# Patient Record
Sex: Female | Born: 1955 | Race: Black or African American | Hispanic: No | Smoking: Never smoker
Health system: Southern US, Community
[De-identification: ages and names within clinical notes are randomized; demographics above are authoritative.]

## PROBLEM LIST (undated history)

## (undated) DIAGNOSIS — M1990 Unspecified osteoarthritis, unspecified site: Secondary | ICD-10-CM

## (undated) DIAGNOSIS — I214 Non-ST elevation (NSTEMI) myocardial infarction: Secondary | ICD-10-CM

## (undated) DIAGNOSIS — I219 Acute myocardial infarction, unspecified: Secondary | ICD-10-CM

## (undated) DIAGNOSIS — E7800 Pure hypercholesterolemia, unspecified: Secondary | ICD-10-CM

## (undated) DIAGNOSIS — E119 Type 2 diabetes mellitus without complications: Secondary | ICD-10-CM

## (undated) DIAGNOSIS — I10 Essential (primary) hypertension: Secondary | ICD-10-CM

## (undated) DIAGNOSIS — H269 Unspecified cataract: Secondary | ICD-10-CM

## (undated) HISTORY — DX: Type 2 diabetes mellitus without complications: E11.9

## (undated) HISTORY — PX: ABDOMINAL HYSTERECTOMY: SHX81

## (undated) HISTORY — PX: TUBAL LIGATION: SHX77

## (undated) HISTORY — PX: CARDIAC CATHETERIZATION: SHX172

## (undated) HISTORY — DX: Unspecified cataract: H26.9

---

## 1999-04-15 DIAGNOSIS — I219 Acute myocardial infarction, unspecified: Secondary | ICD-10-CM | POA: Insufficient documentation

## 1999-04-15 HISTORY — DX: Acute myocardial infarction, unspecified: I21.9

## 2015-12-07 ENCOUNTER — Encounter (HOSPITAL_COMMUNITY)
Admission: EM | Disposition: A | Discharge status: Home / Self Care | Payer: Self-pay | Source: Home / Self Care | Attending: Cardiology

## 2015-12-07 ENCOUNTER — Emergency Department (HOSPITAL_COMMUNITY): Payer: Self-pay

## 2015-12-07 ENCOUNTER — Inpatient Hospital Stay (HOSPITAL_COMMUNITY)
Admission: EM | Admit: 2015-12-07 | Discharge: 2015-12-08 | DRG: 282 | Disposition: A | Discharge status: Home / Self Care | Payer: Self-pay | Attending: Cardiology | Admitting: Cardiology

## 2015-12-07 ENCOUNTER — Encounter (HOSPITAL_COMMUNITY): Payer: Self-pay | Admitting: Cardiology

## 2015-12-07 ENCOUNTER — Ambulatory Visit (HOSPITAL_COMMUNITY): Admit: 2015-12-07 | Payer: Self-pay | Admitting: Cardiology

## 2015-12-07 DIAGNOSIS — Z8249 Family history of ischemic heart disease and other diseases of the circulatory system: Secondary | ICD-10-CM

## 2015-12-07 DIAGNOSIS — E785 Hyperlipidemia, unspecified: Secondary | ICD-10-CM | POA: Diagnosis present

## 2015-12-07 DIAGNOSIS — I252 Old myocardial infarction: Secondary | ICD-10-CM

## 2015-12-07 DIAGNOSIS — I2510 Atherosclerotic heart disease of native coronary artery without angina pectoris: Secondary | ICD-10-CM | POA: Diagnosis present

## 2015-12-07 DIAGNOSIS — I10 Essential (primary) hypertension: Secondary | ICD-10-CM | POA: Diagnosis present

## 2015-12-07 DIAGNOSIS — I214 Non-ST elevation (NSTEMI) myocardial infarction: Principal | ICD-10-CM | POA: Diagnosis present

## 2015-12-07 DIAGNOSIS — Z7982 Long term (current) use of aspirin: Secondary | ICD-10-CM

## 2015-12-07 HISTORY — DX: Acute myocardial infarction, unspecified: I21.9

## 2015-12-07 HISTORY — DX: Non-ST elevation (NSTEMI) myocardial infarction: I21.4

## 2015-12-07 HISTORY — DX: Essential (primary) hypertension: I10

## 2015-12-07 HISTORY — PX: CARDIAC CATHETERIZATION: SHX172

## 2015-12-07 HISTORY — DX: Pure hypercholesterolemia, unspecified: E78.00

## 2015-12-07 LAB — CBC
HCT: 38.2 % (ref 36.0–46.0)
Hemoglobin: 12.7 g/dL (ref 12.0–15.0)
MCH: 29.5 pg (ref 26.0–34.0)
MCHC: 33.2 g/dL (ref 30.0–36.0)
MCV: 88.8 fL (ref 78.0–100.0)
PLATELETS: 254 10*3/uL (ref 150–400)
RBC: 4.3 MIL/uL (ref 3.87–5.11)
RDW: 13.9 % (ref 11.5–15.5)
WBC: 8.1 10*3/uL (ref 4.0–10.5)

## 2015-12-07 LAB — BASIC METABOLIC PANEL
Anion gap: 11 (ref 5–15)
BUN: 9 mg/dL (ref 6–20)
CALCIUM: 9.9 mg/dL (ref 8.9–10.3)
CO2: 27 mmol/L (ref 22–32)
CREATININE: 0.82 mg/dL (ref 0.44–1.00)
Chloride: 100 mmol/L — ABNORMAL LOW (ref 101–111)
GFR calc Af Amer: >60 mL/min (ref >60)
GLUCOSE: 159 mg/dL — AB (ref 65–99)
Potassium: 3.6 mmol/L (ref 3.5–5.1)
SODIUM: 138 mmol/L (ref 135–145)

## 2015-12-07 LAB — I-STAT TROPONIN, ED
Troponin i, poc: 0.06 ng/mL (ref 0.00–0.08)
Troponin i, poc: 5.77 ng/mL (ref 0.00–0.08)

## 2015-12-07 LAB — POCT ACTIVATED CLOTTING TIME: Activated Clotting Time: 246 seconds

## 2015-12-07 LAB — LIPID PANEL
CHOL/HDL RATIO: 4.9 ratio
CHOLESTEROL: 194 mg/dL (ref 0–200)
HDL: 40 mg/dL — ABNORMAL LOW (ref >40)
LDL CALC: 135 mg/dL — AB (ref 0–99)
Triglycerides: 93 mg/dL (ref <150)
VLDL: 19 mg/dL (ref 0–40)

## 2015-12-07 LAB — PROTIME-INR
INR: 1.14
PROTHROMBIN TIME: 14.6 s (ref 11.4–15.2)

## 2015-12-07 LAB — TSH: TSH: 2.283 u[IU]/mL (ref 0.350–4.500)

## 2015-12-07 SURGERY — LEFT HEART CATH AND CORONARY ANGIOGRAPHY

## 2015-12-07 MED ORDER — SODIUM CHLORIDE 0.9 % IV SOLN
INTRAVENOUS | Status: DC | PRN
  Administered 2015-12-07: 70 mL/h via INTRAVENOUS

## 2015-12-07 MED ORDER — ASPIRIN 81 MG PO CHEW: 324.0000 mg | CHEWABLE_TABLET | ORAL | Status: DC

## 2015-12-07 MED ORDER — HEPARIN SODIUM (PORCINE) 1000 UNIT/ML IJ SOLN
INTRAMUSCULAR | Status: AC
  Filled 2015-12-07: qty 1

## 2015-12-07 MED ORDER — MIDAZOLAM HCL 2 MG/2ML IJ SOLN
INTRAMUSCULAR | Status: DC | PRN
  Administered 2015-12-07: 2 mg via INTRAVENOUS

## 2015-12-07 MED ORDER — SODIUM CHLORIDE 0.9 % WEIGHT BASED INFUSION: 3.0000 mL/kg/h | INTRAVENOUS | Status: DC

## 2015-12-07 MED ORDER — HEPARIN SODIUM (PORCINE) 1000 UNIT/ML IJ SOLN
INTRAMUSCULAR | Status: DC | PRN
  Administered 2015-12-07: 6000 [IU] via INTRAVENOUS

## 2015-12-07 MED ORDER — HYDROMORPHONE HCL 1 MG/ML IJ SOLN
INTRAMUSCULAR | Status: DC | PRN
  Administered 2015-12-07: 0.5 mg via INTRAVENOUS

## 2015-12-07 MED ORDER — SODIUM CHLORIDE 0.9 % WEIGHT BASED INFUSION: 1.0000 mL/kg/h | INTRAVENOUS | Status: DC

## 2015-12-07 MED ORDER — VERAPAMIL HCL 2.5 MG/ML IV SOLN
INTRAVENOUS | Status: AC
  Filled 2015-12-07: qty 2

## 2015-12-07 MED ORDER — HEPARIN (PORCINE) IN NACL 2-0.9 UNIT/ML-% IJ SOLN
INTRAMUSCULAR | Status: DC | PRN
  Administered 2015-12-07: 1000 mL

## 2015-12-07 MED ORDER — HEPARIN (PORCINE) IN NACL 100-0.45 UNIT/ML-% IJ SOLN: 750.0000 [IU]/h | INTRAMUSCULAR | Status: DC

## 2015-12-07 MED ORDER — LIDOCAINE HCL (PF) 1 % IJ SOLN
INTRAMUSCULAR | Status: AC
  Filled 2015-12-07: qty 30

## 2015-12-07 MED ORDER — ACETAMINOPHEN 325 MG PO TABS: 650.0000 mg | ORAL_TABLET | ORAL | Status: DC | PRN

## 2015-12-07 MED ORDER — ROSUVASTATIN CALCIUM 10 MG PO TABS
40.0000 mg | ORAL_TABLET | Freq: Every day | ORAL | Status: DC
  Administered 2015-12-07: 40 mg via ORAL
  Filled 2015-12-07: qty 4

## 2015-12-07 MED ORDER — ASPIRIN 300 MG RE SUPP: 300.0000 mg | RECTAL | Status: DC

## 2015-12-07 MED ORDER — HEPARIN SODIUM (PORCINE) 5000 UNIT/ML IJ SOLN
4000.0000 [IU] | Freq: Once | INTRAMUSCULAR | Status: AC
  Administered 2015-12-07: 4000 [IU] via INTRAVENOUS
  Filled 2015-12-07: qty 1

## 2015-12-07 MED ORDER — SODIUM CHLORIDE 0.9% FLUSH: 3.0000 mL | INTRAVENOUS | Status: DC | PRN

## 2015-12-07 MED ORDER — HEPARIN (PORCINE) IN NACL 2-0.9 UNIT/ML-% IJ SOLN
INTRAMUSCULAR | Status: AC
  Filled 2015-12-07: qty 1000

## 2015-12-07 MED ORDER — NITROGLYCERIN 0.4 MG SL SUBL
0.4000 mg | SUBLINGUAL_TABLET | SUBLINGUAL | Status: DC | PRN
  Administered 2015-12-07: 0.4 mg via SUBLINGUAL
  Filled 2015-12-07: qty 1

## 2015-12-07 MED ORDER — ONDANSETRON HCL 4 MG/2ML IJ SOLN: 4.0000 mg | Freq: Four times a day (QID) | INTRAMUSCULAR | Status: DC | PRN

## 2015-12-07 MED ORDER — IOPAMIDOL (ISOVUE-370) INJECTION 76%
INTRAVENOUS | Status: DC | PRN
Start: 2015-12-07 — End: 2015-12-07
  Administered 2015-12-07: 55 mL via INTRA_ARTERIAL

## 2015-12-07 MED ORDER — MIDAZOLAM HCL 2 MG/2ML IJ SOLN
INTRAMUSCULAR | Status: AC
  Filled 2015-12-07: qty 2

## 2015-12-07 MED ORDER — NITROGLYCERIN 1 MG/10 ML FOR IR/CATH LAB
INTRA_ARTERIAL | Status: AC
  Filled 2015-12-07: qty 10

## 2015-12-07 MED ORDER — ASPIRIN 81 MG PO CHEW: 81.0000 mg | CHEWABLE_TABLET | ORAL | Status: DC

## 2015-12-07 MED ORDER — CLOPIDOGREL BISULFATE 75 MG PO TABS: 600.0000 mg | ORAL_TABLET | Freq: Once | ORAL | Status: DC

## 2015-12-07 MED ORDER — CLOPIDOGREL BISULFATE 75 MG PO TABS
75.0000 mg | ORAL_TABLET | Freq: Every day | ORAL | Status: DC
  Administered 2015-12-08: 75 mg via ORAL
  Filled 2015-12-07: qty 1

## 2015-12-07 MED ORDER — IOPAMIDOL (ISOVUE-370) INJECTION 76%
INTRAVENOUS | Status: AC
  Filled 2015-12-07: qty 100

## 2015-12-07 MED ORDER — IRBESARTAN 150 MG PO TABS
150.0000 mg | ORAL_TABLET | Freq: Every day | ORAL | Status: DC
  Administered 2015-12-07: 150 mg via ORAL
  Filled 2015-12-07 (×2): qty 1

## 2015-12-07 MED ORDER — SODIUM CHLORIDE 0.9 % IV SOLN: 250.0000 mL | INTRAVENOUS | Status: DC | PRN

## 2015-12-07 MED ORDER — ASPIRIN EC 81 MG PO TBEC
81.0000 mg | DELAYED_RELEASE_TABLET | Freq: Every day | ORAL | Status: DC
  Administered 2015-12-08: 81 mg via ORAL
  Filled 2015-12-07: qty 1

## 2015-12-07 MED ORDER — VERAPAMIL HCL 2.5 MG/ML IV SOLN
INTRA_ARTERIAL | Status: DC | PRN
  Administered 2015-12-07: 10 mL via INTRA_ARTERIAL

## 2015-12-07 MED ORDER — SODIUM CHLORIDE 0.9% FLUSH
3.0000 mL | Freq: Two times a day (BID) | INTRAVENOUS | Status: DC
  Administered 2015-12-07: 3 mL via INTRAVENOUS

## 2015-12-07 MED ORDER — CARVEDILOL 6.25 MG PO TABS
6.2500 mg | ORAL_TABLET | Freq: Two times a day (BID) | ORAL | Status: DC
  Administered 2015-12-08: 6.25 mg via ORAL
  Filled 2015-12-07: qty 1

## 2015-12-07 MED ORDER — LIDOCAINE HCL (PF) 1 % IJ SOLN
INTRAMUSCULAR | Status: DC | PRN
  Administered 2015-12-07: 2 mL

## 2015-12-07 MED ORDER — HYDROMORPHONE HCL 1 MG/ML IJ SOLN
INTRAMUSCULAR | Status: AC
Start: 2015-12-07 — End: 2015-12-07
  Filled 2015-12-07: qty 1

## 2015-12-07 SURGICAL SUPPLY — 9 items
CATH INFINITI 5 FR JL3.5 (CATHETERS) ×2 IMPLANT
CATH OPTITORQUE TIG 4.0 5F (CATHETERS) ×2 IMPLANT
DEVICE RAD COMP TR BAND LRG (VASCULAR PRODUCTS) ×2 IMPLANT
GLIDESHEATH SLEND A-KIT 6F 20G (SHEATH) ×2 IMPLANT
KIT HEART LEFT (KITS) ×2 IMPLANT
PACK CARDIAC CATHETERIZATION (CUSTOM PROCEDURE TRAY) ×2 IMPLANT
TRANSDUCER W/STOPCOCK (MISCELLANEOUS) ×2 IMPLANT
TUBING CIL FLEX 10 FLL-RA (TUBING) ×2 IMPLANT
WIRE SAFE-T 1.5MM-J .035X260CM (WIRE) ×2 IMPLANT

## 2015-12-07 NOTE — Progress Notes (Signed)
ANTICOAGULATION CONSULT NOTE - Initial Consult  Pharmacy Consult for Heparin Indication: chest pain/ACS  No Known Allergies  Patient Measurements: Height: 5' (152.4 cm) Weight: 157 lb (71.2 kg) IBW/kg (Calculated) : 45.5 Heparin Dosing Weight: 61.2 kg  Vital Signs: Temp: 98.6 F (37 C) (08/25 0829) Temp Source: Oral (08/25 0829) BP: 112/98 (08/25 1530) Pulse Rate: 76 (08/25 1530)  Labs:  Recent Labs  12/07/15 0844  HGB 12.7  HCT 38.2  PLT 254  CREATININE 0.82    Estimated Creatinine Clearance: 63.5 mL/min (by C-G formula based on SCr of 0.82 mg/dL).   Medical History: No past medical history on file.  Assessment: Patient was admitted with chest pain. History of MI in 2001. PTA, pt on aspirin 81 mg qd. Troponin at 5.77. K and Mag WNL . CBC Normal. Scr 0.82. Patient is currently in Cath Lab.  Goal of Therapy:  Heparin level 0.3-0.7 units/ml Monitor platelets by anticoagulation protocol: Yes   Plan:  Give 3700 units bolus x 1  Continue Heparin gtt @ 750 units IV/hour Will not order labs at this time. Follow up after patient leaves Cath Lab.   Curt Bears Deryl Ports 12/07/2015,4:41 PM

## 2015-12-07 NOTE — ED Triage Notes (Signed)
Pt in from home via [facility], per report pt had mid non radiating CP onset upon awakening at 6 am, pt took 486 mg ASA, pt rcvd x 2 SL nitro pta, pain from 10/10 to 0/10 post nitro, pt hx of MI, denies N/V/D & SOB, A&O x4

## 2015-12-07 NOTE — ED Notes (Signed)
Cardiologist in room

## 2015-12-07 NOTE — ED Provider Notes (Signed)
[facility] DEPT Provider Note   CSN: [id number] Arrival date & time: 12/07/15  0825     History   Chief Complaint Chief Complaint  Patient presents with  . Chest Pain    HPI Tiffany Powers is a 60 y.o. female.  HPI Patient presents with right to left chest cramping that started around 6:30 this morning while she was showering. No associated shortness of breath or nausea. States her symptoms are similar to when she had a heart attack back in 2001. She states she was treated medically with no stent placement. She recently moved from [place] and does not have a cardiologist in [place]. Was given aspirin and nitroglycerin 2 in route by EMS with significant improvement of her chest pain. States that her pain is currently 3/10. Denies any recent illness, cough, fever or chills. Denies any recent extended travel. No lower extremity swelling or pain. No past medical history on file.  There are no active problems to display for this patient.   No past surgical history on file.  OB History    No data available       Home Medications    Prior to Admission medications   Medication Sig Start Date End Date Taking? Authorizing Provider  aspirin EC 81 MG tablet Take 81 mg by mouth daily.   Yes Historical Provider, MD  Cyanocobalamin (VITAMIN B-12 PO) Take 1 tablet by mouth daily.   Yes Historical Provider, MD  hydrochlorothiazide (HYDRODIURIL) 25 MG tablet Take 25 mg by mouth daily.   Yes Historical Provider, MD  rosuvastatin (CRESTOR) 40 MG tablet Take 40 mg by mouth daily.   Yes Historical Provider, MD  telmisartan (MICARDIS) 40 MG tablet Take 40 mg by mouth daily.   Yes Historical Provider, MD    Family History No family history on file.  Social History Social History  Substance Use Topics  . Smoking status: Not on file  . Smokeless tobacco: Not on file  . Alcohol use Not on file     Allergies   Review of patient's allergies indicates no known  allergies.   Review of Systems Review of Systems  Constitutional: Negative for chills and fever.  Respiratory: Negative for chest tightness and shortness of breath.   Cardiovascular: Positive for chest pain. Negative for leg swelling.  Gastrointestinal: Negative for abdominal pain, diarrhea, nausea and vomiting.  Genitourinary: Negative for dysuria, flank pain and frequency.  Musculoskeletal: Negative for back pain, myalgias, neck pain and neck stiffness.  Skin: Negative for rash and wound.  Neurological: Negative for dizziness, weakness, light-headedness, numbness and headaches.  All other systems reviewed and are negative.    Physical Exam Updated Vital Signs BP 136/71   Pulse 76   Temp 98.6 F (37 C) (Oral)   Resp 17   SpO2 96%   Physical Exam  Constitutional: She is oriented to person, place, and time. She appears well-developed and well-nourished.  HENT:  Head: Normocephalic and atraumatic.  Mouth/Throat: Oropharynx is clear and moist.  Eyes: EOM are normal. Pupils are equal, round, and reactive to light.  Neck: Normal range of motion. Neck supple. No JVD present.  Cardiovascular: Normal rate, regular rhythm and normal heart sounds.  Exam reveals no gallop and no friction rub.   No murmur heard. Pulmonary/Chest: Effort normal and breath sounds normal. No respiratory distress. She has no wheezes. She has no rales. She exhibits no tenderness.  Abdominal: Soft. Bowel sounds are normal. There is no tenderness. There is no rebound and no  guarding.  Musculoskeletal: Normal range of motion. She exhibits no edema or tenderness.  No lower sugary swelling, asymmetry or tenderness. Distal pulses are equal  Neurological: She is alert and oriented to person, place, and time.  Moves all extremities without deficit. Sensation is fully intact.  Skin: Skin is warm and dry. No rash noted. No erythema.  Psychiatric: She has a normal mood and affect. Her behavior is normal.  Nursing note  and vitals reviewed.    ED Treatments / Results  Labs (all labs ordered are listed, but only abnormal results are displayed) Labs Reviewed  BASIC METABOLIC PANEL - Abnormal; Notable for the following:       Result Value   Chloride 100 (*)    Glucose, Bld 159 (*)    All other components within normal limits  I-STAT TROPOININ, ED - Abnormal; Notable for the following:    Troponin i, poc 5.77 (*)    All other components within normal limits  CBC  I-STAT TROPOININ, ED    EKG  EKG Interpretation  Date/Time:  Friday December 07 2015 08:41:38 EDT Ventricular Rate:  68 PR Interval:    QRS Duration: 96 QT Interval:  402 QTC Calculation: 428 R Axis:   -113 Text Interpretation:  Sinus rhythm Left anterior fascicular block Abnormal R-wave progression, late transition Borderline T abnormalities, anterior leads Confirmed by Lita Mains  MD, Manjot Beumer ([id number]) on 12/07/2015 9:17:37 AM       Radiology Dg Chest 2 View  Result Date: 12/07/2015 CLINICAL DATA:  Right-sided chest pain radiating into both buttocks, history of MI in the past, nonsmoker. EXAM: CHEST  2 VIEW COMPARISON:  None in PACs FINDINGS: The lungs are adequately inflated and clear. The heart and pulmonary vascularity are normal. The mediastinum is normal in width. There is no pleural effusion. 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 IMPRESSION: There is no active cardiopulmonary disease. Electronically Signed   By: Alli Jasmer  Martinique M.D.   On: 12/07/2015 09:13    Procedures Procedures (including critical care time)  Medications Ordered in ED Medications  nitroGLYCERIN (NITROSTAT) SL tablet 0.4 mg (0.4 mg Sublingual Given 12/07/15 1108)  heparin injection 4,000 Units (4,000 Units Intravenous Given 12/07/15 1418)     Initial Impression / Assessment and Plan / ED Course  I have reviewed the triage vital signs and the nursing notes.  Pertinent labs & imaging results that were available during my care of  the patient were reviewed by me and considered in my medical decision making (see chart for details).  Clinical Course   Patient given 1 dose of nitroglycerin emergency department with complete resolution of her chest pain and pressure. Discussed with nurse for Dr. Einar Gip. We will evaluate patient in the emergency department.  Repeat troponin positive at 5.77. Patient remains chest pain-free. We'll start heparin and repage Dr. Einar Gip.   Dr. Einar Gip will admit and cath today. Advises to keep patient nothing by mouth.   Final Clinical Impressions(s) / ED Diagnoses   Final diagnoses:  NSTEMI (non-ST elevated myocardial infarction) [facility])    New Prescriptions New Prescriptions   No medications on file     Julianne Rice, MD 12/07/15 1515

## 2015-12-07 NOTE — Progress Notes (Addendum)
Patient arrived to 2W via bed from cath lab in no apparent distress.  Patient was oriented to unit to include phone and call light.  Telemetry monitor was applied and CCMD notified.  Will continue to monitor.

## 2015-12-07 NOTE — H&P (Signed)
Tiffany Powers is an 60 y.o. female.   Chief Complaint: Chest pain HPI: Tiffany Powers  is a 60 y.o. female with a history of HTN, HLD, and CAD s/p MI in 2001, treated medically in [place].  She recently moved from [place] and does not have a cardiologist in [place]. Patient presented with left chest cramping that started around 6:30 this morning while she was showering. States symptoms felt similar to previous MI. No associated shortness of breath or nausea, however, reported nausea and fatigue yesterday. Was given aspirin and nitroglycerin 2 in route by EMS with significant improvement of her chest pain. Currently pain free. Denies any recent illness, cough, fever or chills. Denies any recent extended travel. No lower extremity swelling or pain.  No past medical history on file.  No past surgical history on file.  No family history on file. Social History:  has no tobacco, alcohol, and drug history on file.  Allergies: No Known Allergies  Review of Systems - History obtained from the patient General ROS: positive for  - fatigue negative for - fever, weight gain or weight loss Respiratory ROS: no cough, shortness of breath, or wheezing Cardiovascular ROS: positive for - chest pain negative for - edema, orthopnea, palpitations or paroxysmal nocturnal dyspnea Gastrointestinal ROS: positive for - nausea negative for - abdominal pain Neurological ROS: no TIA or stroke symptoms  Blood pressure 123/68, pulse 74, temperature 98.6 F (37 C), temperature source Oral, resp. rate 19, SpO2 97 %.   General appearance: alert, cooperative, appears stated age and no distress Eyes: negative Neck: no adenopathy, no carotid bruit, no JVD, supple, symmetrical, trachea midline and thyroid not enlarged, symmetric, no tenderness/mass/nodules Resp: clear to auscultation bilaterally Chest wall: no tenderness Cardio: regular rate and rhythm, S1, S2 normal, no murmur, click, rub or gallop GI: soft,  non-tender; bowel sounds normal; no masses,  no organomegaly Extremities: extremities normal, atraumatic, no cyanosis or edema Pulses: 2+ and symmetric Skin: Skin color, texture, turgor normal. No rashes or lesions Neurologic: Grossly normal  Results for orders placed or performed during the hospital encounter of 12/07/15 (from the past 48 hour(s))  Basic metabolic panel     Status: Abnormal   Collection Time: 12/07/15  8:44 AM  Result Value Ref Range   Sodium 138 135 - 145 mmol/L   Potassium 3.6 3.5 - 5.1 mmol/L   Chloride 100 (L) 101 - 111 mmol/L   CO2 27 22 - 32 mmol/L   Glucose, Bld 159 (H) 65 - 99 mg/dL   BUN 9 6 - 20 mg/dL   Creatinine, Ser 0.82 0.44 - 1.00 mg/dL   Calcium 9.9 8.9 - 10.3 mg/dL   GFR calc non Af Amer >60 >60 mL/min   GFR calc Af Amer >60 >60 mL/min    Comment: (NOTE) The eGFR has been calculated using the CKD EPI equation. This calculation has not been validated in all clinical situations. eGFR's persistently <60 mL/min signify possible Chronic Kidney Disease.    Anion gap 11 5 - 15  CBC     Status: None   Collection Time: 12/07/15  8:44 AM  Result Value Ref Range   WBC 8.1 4.0 - 10.5 K/uL   RBC 4.30 3.87 - 5.11 MIL/uL   Hemoglobin 12.7 12.0 - 15.0 g/dL   HCT 38.2 36.0 - 46.0 %   MCV 88.8 78.0 - 100.0 fL   MCH 29.5 26.0 - 34.0 pg   MCHC 33.2 30.0 - 36.0 g/dL   RDW 13.9 11.5 -  15.5 %   Platelets 254 150 - 400 K/uL  I-stat troponin, ED     Status: None   Collection Time: 12/07/15  8:56 AM  Result Value Ref Range   Troponin i, poc 0.06 0.00 - 0.08 ng/mL   Comment 3            Comment: Due to the release kinetics of cTnI, a negative result within the first hours of the onset of symptoms does not rule out myocardial infarction with certainty. If myocardial infarction is still suspected, repeat the test at appropriate intervals.   I-stat troponin, ED     Status: Abnormal   Collection Time: 12/07/15  1:51 PM  Result Value Ref Range   Troponin i,  poc 5.77 (HH) 0.00 - 0.08 ng/mL   Comment NOTIFIED PHYSICIAN    Comment 3            Comment: Due to the release kinetics of cTnI, a negative result within the first hours of the onset of symptoms does not rule out myocardial infarction with certainty. If myocardial infarction is still suspected, repeat the test at appropriate intervals.    Dg Chest 2 View  Result Date: 12/07/2015 CLINICAL DATA:  Right-sided chest pain radiating into both buttocks, history of MI in the past, nonsmoker. EXAM: CHEST  2 VIEW COMPARISON:  None in PACs FINDINGS: The lungs are adequately inflated and clear. The heart and pulmonary vascularity are normal. The mediastinum is normal in width. There is no pleural effusion. 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 IMPRESSION: There is no active cardiopulmonary disease. Electronically Signed   By: David  Martinique M.D.   On: 12/07/2015 09:13    Labs:   Lab Results  Component Value Date   WBC 8.1 12/07/2015   HGB 12.7 12/07/2015   HCT 38.2 12/07/2015   MCV 88.8 12/07/2015   PLT 254 12/07/2015    Recent Labs Lab 12/07/15 0844  NA 138  K 3.6  CL 100*  CO2 27  BUN 9  CREATININE 0.82  CALCIUM 9.9  GLUCOSE 159*    Lipid Panel  No results found for: CHOL, TRIG, HDL, CHOLHDL, VLDL, LDLCALC  BNP (last 3 results) No results for input(s): BNP in the last 8760 hours.  HEMOGLOBIN A1C No results found for: HGBA1C, MPG  Cardiac Panel (last 3 results) No results for input(s): CKTOTAL, CKMB, TROPONINI, RELINDX in the last 8760 hours.  No results found for: CKTOTAL, CKMB, CKMBINDEX, TROPONINI   TSH No results for input(s): TSH in the last 8760 hours.  EKG 12/07/2015 at 0841: sinus rhythm at a rate of 68bpm, LAD, LAFB, PRWP, anterolateral T wave abnormality, cannot exclude ischemia.   (Not in a hospital admission)    Current Facility-Administered Medications:  .  heparin injection 4,000 Units, 4,000 Units, Intravenous, Once,  Julianne Rice, MD .  nitroGLYCERIN (NITROSTAT) SL tablet 0.4 mg, 0.4 mg, Sublingual, Q5 min PRN, Julianne Rice, MD, 0.4 mg at 12/07/15 1108  Current Outpatient Prescriptions:  .  aspirin EC 81 MG tablet, Take 81 mg by mouth daily., Disp: , Rfl:  .  Cyanocobalamin (VITAMIN B-12 PO), Take 1 tablet by mouth daily., Disp: , Rfl:  .  hydrochlorothiazide (HYDRODIURIL) 25 MG tablet, Take 25 mg by mouth daily., Disp: , Rfl:  .  rosuvastatin (CRESTOR) 40 MG tablet, Take 40 mg by mouth daily., Disp: , Rfl:  .  telmisartan (MICARDIS) 40 MG tablet, Take 40 mg by mouth daily., Disp: , Rfl:  Assessment/Plan 1. NSTEMI 2. CAD s/p MI in 2001, treated medically ([place]) 3. HLD 4. Essential Hypertension 5. Family history of premature CAD (Father had multiple MIs in early and mid 58s, died from MI in 51s, brother died from MI in late 62s.  Recommendation: Pt's symptoms concerning for unstable angina, now with elevated troponin, initial troponin negative. Will plan for coronary angiogram this afternoon for NSTEMI. Dr. Einar Gip to see to with further recommendations.  Rachel Bo, NP-C 12/07/2015, 2:16 PM [facility] Pager: [phone] Office: [phone]

## 2015-12-08 MED ORDER — CARVEDILOL 6.25 MG PO TABS: 6.2500 mg | ORAL_TABLET | Freq: Two times a day (BID) | ORAL | 1 refills | Status: DC

## 2015-12-08 MED ORDER — NITROGLYCERIN 0.4 MG SL SUBL: 0.4000 mg | SUBLINGUAL_TABLET | SUBLINGUAL | 1 refills | Status: DC | PRN

## 2015-12-08 MED ORDER — CLOPIDOGREL BISULFATE 75 MG PO TABS: 75.0000 mg | ORAL_TABLET | Freq: Every day | ORAL | 1 refills | Status: DC

## 2015-12-08 NOTE — Progress Notes (Signed)
TR Band removed, right radial vascular site Level 0, no bleeding noted. Occlusive dressing applied to right radial.

## 2015-12-08 NOTE — Progress Notes (Signed)
Pt/family given discharge instructions, medication lists, follow up appointments, and when to call the doctor.  Pt/family verbalizes understanding. Lewellyn Fultz McClintock, RN   

## 2015-12-08 NOTE — Discharge Summary (Signed)
Physician Discharge Summary  Patient ID: Tiffany Powers MRN: [id number] DOB/AGE: 1955/11/16 60 y.o.  Admit date: 12/07/2015 Discharge date: 12/08/2015  Discharge Diagnoses: 1. NSTEMI 2. CAD s/p MI in 2001, treated medically ([place]) 3. HLD 4. Essential Hypertension 5. Family history of premature CAD (Father had multiple MIs in early and mid 7s, died from MI in 25s, brother died from MI in late 57s.  Significant Diagnostic Studies: Coronary Angiogram 12/07/2015: Normal LV systolic function, no wall motion of normality. EF 55-60%. Normal coronary arteries except for ostial LAD, at most 30% stenosis, otherwise smooth and normal. A very small ramus intermediate which measures less than 0.5 mm and has very small area of distribution has a hazy 99% stenosis, probably the culprit lesion. RCA dominant and smooth and normal, circumflex normal.  Hospital Course: Tiffany Powers  is a 60 y.o. female with a history of HTN, HLD, and CAD s/p MI in 2001, treated medically in [place].  She recently moved from [place] and does not have a cardiologist in [place]. Patient presented with left chest cramping that started around 6:30 this morning while she was showering. States symptoms felt similar to previous MI. No associated shortness of breath or nausea, however, reported nausea and fatigue yesterday. Was given aspirin and nitroglycerin 2 in route by EMS with significant improvement of her chest pain. Currently pain free. Denies any recent illness, cough, fever or chills. Denies any recent extended travel. No lower extremity swelling or pain.  She was taken to the cath lab due to elevated delta troponin, coronary angiogram revealed ostial LAD with at most 30% stenosis, otherwise smooth and normal, a very small ramus intermediate which measures less than 0.5 mm and has very small area of distribution has a hazy 99% stenosis, probably the culprit lesion. RCA dominant and smooth and normal, circumflex  normal. Medical therapy recommended.   Recommendations on discharge: Add Coreg and plavix, otherwise on appropriate medical therapy. Follow up outpatient.   Discharge Exam: Blood pressure (!) 102/50, pulse 73, temperature 98.7 F (37.1 C), temperature source Oral, resp. rate 18, height 5' (1.524 m), weight 71.2 kg (157 lb), SpO2 99 %.    General appearance: alert, cooperative, appears stated age and no distress Eyes: negative Neck: no adenopathy, no carotid bruit, no JVD, supple, symmetrical, trachea midline and thyroid not enlarged, symmetric, no tenderness/mass/nodules Resp: clear to auscultation bilaterally Chest wall: no tenderness Cardio: regular rate and rhythm, S1, S2 normal, no murmur, click, rub or gallop GI: soft, non-tender; bowel sounds normal; no masses,  no organomegaly Extremities: extremities normal, atraumatic, no cyanosis or edema Pulses: 2+ and symmetric Skin: Skin color, texture, turgor normal. No rashes or lesions Neurologic: Grossly normal  Labs:   Lab Results  Component Value Date   WBC 8.1 12/07/2015   HGB 12.7 12/07/2015   HCT 38.2 12/07/2015   MCV 88.8 12/07/2015   PLT 254 12/07/2015    Recent Labs Lab 12/07/15 0844  NA 138  K 3.6  CL 100*  CO2 27  BUN 9  CREATININE 0.82  CALCIUM 9.9  GLUCOSE 159*    Lipid Panel     Component Value Date/Time   CHOL 194 12/07/2015 1637   TRIG 93 12/07/2015 1637   HDL 40 (L) 12/07/2015 1637   CHOLHDL 4.9 12/07/2015 1637   VLDL 19 12/07/2015 1637   LDLCALC 135 (H) 12/07/2015 1637    BNP (last 3 results) No results for input(s): BNP in the last 8760 hours.  HEMOGLOBIN A1C No results  found for: HGBA1C, MPG  Cardiac Panel (last 3 results) No results for input(s): CKTOTAL, CKMB, TROPONINI, RELINDX in the last 8760 hours.  No results found for: CKTOTAL, CKMB, CKMBINDEX, TROPONINI   TSH  Recent Labs  12/07/15 1637  TSH 2.283   EKG 12/08/2015: sinus rhythm at a rate of 72 bpm, PRWP, anterolateral  T wave abnormality, cannot exclude ischemia.  EKG 12/07/2015 at 0841: sinus rhythm at a rate of 68bpm, LAD, LAFB, PRWP, anterolateral T wave abnormality, cannot exclude ischemia.   Radiology: Dg Chest 2 View  Result Date: 12/07/2015 CLINICAL DATA:  Right-sided chest pain radiating into both buttocks, history of MI in the past, nonsmoker. EXAM: CHEST  2 VIEW COMPARISON:  None in PACs FINDINGS: The lungs are adequately inflated and clear. The heart and pulmonary vascularity are normal. The mediastinum is normal in width. There is no pleural effusion. 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 IMPRESSION: There is no active cardiopulmonary disease. Electronically Signed   By: David  Martinique M.D.   On: 12/07/2015 09:13      FOLLOW UP PLANS AND APPOINTMENTS  Follow-up Information    Rachel Bo, NP. Schedule an appointment as soon as possible for a visit in 1 week(s).   Specialty:  Nurse Practitioner Contact information: [address] [place] [address] [phone]             Medication List    TAKE these medications   aspirin EC 81 MG tablet Take 81 mg by mouth daily.   carvedilol 6.25 MG tablet Commonly known as:  COREG Take 1 tablet (6.25 mg total) by mouth 2 (two) times daily with a meal.   clopidogrel 75 MG tablet Commonly known as:  PLAVIX Take 1 tablet (75 mg total) by mouth daily.   hydrochlorothiazide 25 MG tablet Commonly known as:  HYDRODIURIL Take 25 mg by mouth daily.   nitroGLYCERIN 0.4 MG SL tablet Commonly known as:  NITROSTAT Place 1 tablet (0.4 mg total) under the tongue every 5 (five) minutes as needed for chest pain.   rosuvastatin 40 MG tablet Commonly known as:  CRESTOR Take 40 mg by mouth daily.   telmisartan 40 MG tablet Commonly known as:  MICARDIS Take 40 mg by mouth daily.   VITAMIN B-12 PO Take 1 tablet by mouth daily.         Rachel Bo, NP-C 12/08/2015, 9:14  AM [facility] Pager: [phone] Office: [phone]

## 2015-12-08 NOTE — Discharge Instructions (Signed)
Acute Coronary Syndrome  Acute coronary syndrome (ACS) is a serious problem in which there is suddenly not enough blood and oxygen supplied to the heart. ACS may mean that one or more of the blood vessels in your heart (coronary arteries) may be blocked. ACS can result in chest pain or a heart attack (myocardial infarction or MI).  CAUSES  This condition is caused by atherosclerosis, which is the buildup of fat and cholesterol (plaque) on the inside of the arteries. Over time, the plaque may narrow or block the artery, and this will lessen blood flow to the heart. Plaque can also become weak and break off within a coronary artery to form a clot and cause a sudden blockage.  RISK FACTORS  The risks factors of this condition include:   High cholesterol levels.   High blood pressure (hypertension).   Smoking.   Diabetes.   Age.   Family history of chest pain, heart disease, or stroke.   Lack of exercise.  SYMPTOMS  The most common signs of this condition include:   Chest pain, which can be:    A crushing or squeezing in the chest.    A tightness, pressure, fullness, or heaviness in the chest.    Present for more than a few minutes, or it can stop and recur.   Pain in the arms, neck, jaw, or back.   Unexplained heartburn or indigestion.   Shortness of breath.   Nausea.   Sudden cold sweats.   Feeling light-headed or dizzy.  Sometimes, this condition has no symptoms.  DIAGNOSIS  ACS may be diagnosed through the following tests:   Electrocardiogram (ECG).   Blood tests.   Coronary angiogram. This is a procedure to look at the coronary arteries to see if there is any blockage.  TREATMENT  Treatment for ACS may include:   Healthy behavioral changes to reduce or control risk factors.   Medicine.   Coronary stenting.A stent helps to keep an artery open.   Coronary angioplasty. This procedure widens a narrowed or blocked artery.   Coronary artery bypass surgery. This will allow your blood to pass the  blockage (bypass) to reach your heart.  HOME CARE INSTRUCTIONS  Eating and Drinking   Follow a heart-healthy diet. A dietitian can you help to educate you about healthy food options and changes.   Use healthy cooking methods such as roasting, grilling, broiling, baking, poaching, steaming, or stir-frying. Talk to a dietitian to learn more about healthy cooking methods.  Medicines   Take medicines only as directed by your health care provider.   Do not take the following medicines unless your health care provider approves:    Nonsteroidal anti-inflammatory drugs (NSAIDs), such as ibuprofen, naproxen, or celecoxib.    Vitamin supplements that contain vitamin A, vitamin E, or both.    Hormone replacement therapy that contains estrogen with or without progestin.   Stop illegal drug use.  Activities   Follow an exercise program that is approved by your health care provider.   Plan rest periods when you are fatigued.  Lifestyle   Do not use any tobacco products, including cigarettes, chewing tobacco, or electronic cigarettes. If you need help quitting, ask your health care provider.   If you drink alcohol, and your health care provider approves, limit your alcohol intake to no more than 1 drink per day. One drink equals 12 ounces of beer, 5 ounces of wine, or 1 ounces of hard liquor.   Learn to manage   stress.   Maintain a healthy weight. Lose weight as approved by your health care provider.  General Instructions   Manage other health conditions, such as hypertension and diabetes, as directed by your health care provider.   Keep all follow-up visits as directed by your health care provider. This is important.   Your health care provider may ask you to monitor your blood pressure. A blood pressure reading consists of a higher number over a lower number, such as 110 over 72, written as 110/72. Ideally, your blood pressure should be:    Below 140/90 if you have no other medical conditions.    Below 130/80 if  you have diabetes or kidney disease.  SEEK IMMEDIATE MEDICAL CARE IF:   You have pain in your chest, neck, arm, jaw, stomach, or back that lasts more than a few minutes, is recurring, or is not relieved by taking medicine under your tongue (sublingual nitroglycerin).   You have profuse sweating without cause.   You have unexplained:    Heartburn or indigestion.    Shortness of breath or difficulty breathing.    Nausea or vomiting.    Fatigue.    Feelings of nervousness or anxiety.    Weakness.    Diarrhea.   You have sudden light-headedness or dizziness.   You faint.  These symptoms may represent a serious problem that is an emergency. Do not wait to see if the symptoms will go away. Get medical help right away. Call your local emergency services (911 in the [place] Do not drive yourself to the clinic or hospital.     This information is not intended to replace advice given to you by your health care provider. Make sure you discuss any questions you have with your health care provider.     Document Released: 03/31/2005 Document Revised: 04/21/2014 Document Reviewed: 08/02/2013  Elsevier Interactive Patient Education 2016 Elsevier Inc.

## 2015-12-10 ENCOUNTER — Encounter (HOSPITAL_COMMUNITY): Payer: Self-pay | Admitting: Cardiology

## 2016-07-30 ENCOUNTER — Ambulatory Visit (INDEPENDENT_AMBULATORY_CARE_PROVIDER_SITE_OTHER): Payer: BLUE CROSS/BLUE SHIELD | Admitting: Family Medicine

## 2016-07-30 VITALS — BP 116/56 | HR 70 | Temp 98.3°F | Ht 62.0 in | Wt 157.8 lb

## 2016-07-30 DIAGNOSIS — Z1329 Encounter for screening for other suspected endocrine disorder: Secondary | ICD-10-CM | POA: Diagnosis not present

## 2016-07-30 DIAGNOSIS — I252 Old myocardial infarction: Secondary | ICD-10-CM | POA: Diagnosis not present

## 2016-07-30 DIAGNOSIS — Z130 Encounter for screening for diseases of the blood and blood-forming organs and certain disorders involving the immune mechanism: Secondary | ICD-10-CM

## 2016-07-30 DIAGNOSIS — Z131 Encounter for screening for diabetes mellitus: Secondary | ICD-10-CM | POA: Diagnosis not present

## 2016-07-30 DIAGNOSIS — F5102 Adjustment insomnia: Secondary | ICD-10-CM

## 2016-07-30 DIAGNOSIS — E785 Hyperlipidemia, unspecified: Secondary | ICD-10-CM

## 2016-07-30 DIAGNOSIS — Z1321 Encounter for screening for nutritional disorder: Secondary | ICD-10-CM

## 2016-07-30 HISTORY — DX: Hyperlipidemia, unspecified: E78.5

## 2016-07-30 HISTORY — DX: Adjustment insomnia: F51.02

## 2016-07-30 MED ORDER — ROSUVASTATIN CALCIUM 40 MG PO TABS: 40.0000 mg | ORAL_TABLET | Freq: Every day | ORAL | 3 refills | Status: DC

## 2016-07-30 MED ORDER — ZOLPIDEM TARTRATE 5 MG PO TABS: 5.0000 mg | ORAL_TABLET | Freq: Every evening | ORAL | 1 refills | Status: DC | PRN

## 2016-07-30 NOTE — Progress Notes (Addendum)
[place] at [facility] [address] [place], [place] [address] [phone]  Date:  07/30/2016   Name:  Tiffany Powers   DOB:  [birth year]   MRN:  [id number]  PCP:  Lamar Blinks, MD    Chief Complaint: Establish Care (Would like to re-start Ambien)   History of Present Illness:  Tiffany Powers is a 61 y.o. very pleasant female patient who presents with the following: Establishing care with me today  She had been living in this area in the past, then moved to [place] for several years, returned here last year to be closer to family She did have a NSTEMI last year- otherwise she has been in good health.   Her dad and brother both died of heart attacks She has seen Dr. 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  Her mother is still safe in her own home, but she does need close supervision.  Her sister also has mild dementia at a young age  She is not taking aspirin on the advice of cardiology- she does take plavix at this time She has not needed to use her nitro  She would like for [place] to do her OB-GYN care which is fine.    She last had labs in the fall through wellness program for her work She is not fasting right now but would like to go ahead and get labs today She is a never smoker  She has used [place] in the past for periodic insomnia.  Wonders if she might go back on this for prn use.  She finds that some nights she will worry about her family and lie awake for hours.  However she is not experiencing depression.  She has used [place] safely in the past No other sedating medications Patient Active  Problem List   Diagnosis Date Noted  . NSTEMI (non-ST elevated myocardial infarction) ([place]) 12/07/2015    Past Medical History:  Diagnosis Date  . High cholesterol   . Hypertension   . MI (myocardial infarction) 2001  . NSTEMI (non-ST elevated myocardial infarction) ([place]) 12/07/2015    Past Surgical History:  Procedure Laterality Date  . CARDIAC CATHETERIZATION  2001; 2002; 12/06/2105  . CARDIAC CATHETERIZATION N/A 12/07/2015   Procedure: Left Heart Cath and Coronary Angiography;  Surgeon: [name] Prows, MD;  Location: [facility];  Service: Cardiovascular;  Laterality: N/A;  . TUBAL LIGATION  1980s    Social History  Substance Use Topics  . Smoking status: Never Smoker  . Smokeless tobacco: Never Used  . Alcohol use Not on file    Family History  Problem Relation Age of Onset  . CAD Father 32    MI  . CAD Brother 6    MI  . CAD Maternal Grandmother 65    CABG    No Known Allergies  Medication list has been reviewed and updated.  Current Outpatient Prescriptions on File Prior to Visit  Medication Sig Dispense Refill  . carvedilol (COREG) 6.25 MG tablet Take 1 tablet (6.25 mg total) by mouth 2 (two) times daily with a meal. 60 tablet 1  .  clopidogrel (PLAVIX) 75 MG tablet Take 1 tablet (75 mg total) by mouth daily. 30 tablet 1  . Cyanocobalamin (VITAMIN B-12 PO) Take 1 tablet by mouth daily.    . hydrochlorothiazide (HYDRODIURIL) 25 MG tablet Take 25 mg by mouth daily.    . nitroGLYCERIN (NITROSTAT) 0.4 MG SL tablet Place 1 tablet (0.4 mg total) under the tongue every 5 (five) minutes as needed for chest pain. 25 tablet 1  . rosuvastatin (CRESTOR) 40 MG tablet Take 40 mg by mouth daily.    Marland Kitchen telmisartan (MICARDIS) 40 MG tablet Take 40 mg by mouth daily.    Marland Kitchen aspirin EC 81 MG tablet Take 81 mg by mouth daily.     No current facility-administered medications on file prior to visit.     Review of Systems:  As per HPI- otherwise negative. No CP or SOB No  nausea, vomiting, diarrhea No rash  Physical Examination: Vitals:   07/30/16 1509  BP: (!) 116/56  Pulse: 70  Temp: 98.3 F (36.8 C)   Vitals:   07/30/16 1509  Weight: 157 lb 12.8 oz (71.6 kg)  Height: 5\' 2"  (1.575 m)   Body mass index is 28.86 kg/m. Ideal Body Weight: Weight in (lb) to have BMI = 25: 136.4  GEN: WDWN, NAD, Non-toxic, A & O x 3 HEENT: Atraumatic, Normocephalic. Neck supple. No masses, No LAD. Ears and Nose: No external deformity. CV: RRR, No M/G/R. No JVD. No thrill. No extra heart sounds. PULM: CTA B, no wheezes, crackles, rhonchi. No retractions. No resp. distress. No accessory muscle use. ABD: S, NT, ND, +BS. No rebound. No HSM. EXTR: No c/c/e NEURO Normal gait.  PSYCH: Normally interactive. Conversant. Not depressed or anxious appearing.  Calm demeanor.  Overweight, otherwise looks well  Assessment and Plan: History of MI (myocardial infarction) - Plan: Ambulatory referral to Cardiology  Screening for diabetes mellitus - Plan: Comprehensive metabolic panel, Hemoglobin A1c  Hyperlipidemia, unspecified hyperlipidemia type - Plan: rosuvastatin (CRESTOR) 40 MG tablet, Lipid panel  Screening for deficiency anemia - Plan: CBC  Encounter for vitamin deficiency screening - Plan: Vitamin D (25 hydroxy)  Screening for thyroid disorder - Plan: TSH  Adjustment insomnia - Plan: zolpidem (AMBIEN) 5 MG tablet  Here today to establish care- she had a NSTEMI last year and needs to establish with cardiology.   Made referral for her Refilled her crestor Discussed Lorrin Mais- she may go back on this prn, discussed safe use Requested records from [facility] for her Labs pending as Jethro Poling   Signed Lamar Blinks, MD  Received her labs 4/20  Results for orders placed or performed in visit on 07/30/16  CBC  Result Value Ref Range   WBC 6.5 4.0 - 10.5 K/uL   RBC 4.35 3.87 - 5.11 Mil/uL   Platelets 281.0 150.0 - 400.0 K/uL   Hemoglobin 13.2 12.0 - 15.0 g/dL    HCT 38.4 36.0 - 46.0 %   MCV 88.2 78.0 - 100.0 fl   MCHC 34.3 30.0 - 36.0 g/dL   RDW 13.8 11.5 - 15.5 %  Comprehensive metabolic panel  Result Value Ref Range   Sodium 140 135 - 145 mEq/L   Potassium 3.7 3.5 - 5.1 mEq/L   Chloride 105 96 - 112 mEq/L   CO2 28 19 - 32 mEq/L   Glucose, Bld 109 (H) 70 - 99 mg/dL   BUN 16 6 - 23 mg/dL   Creatinine, Ser 0.75 0.40 - 1.20 mg/dL   Total Bilirubin 0.4 0.2 -  1.2 mg/dL   Alkaline Phosphatase 68 39 - 117 U/L   AST 20 0 - 37 U/L   ALT 21 0 - 35 U/L   Total Protein 7.3 6.0 - 8.3 g/dL   Albumin 4.5 3.5 - 5.2 g/dL   Calcium 9.9 8.4 - 10.5 mg/dL   GFR 100.65 >60.00 mL/min  Lipid panel  Result Value Ref Range   Cholesterol 183 0 - 200 mg/dL   Triglycerides 198.0 (H) 0.0 - 149.0 mg/dL   HDL 35.30 (L) >39.00 mg/dL   VLDL 39.6 0.0 - 40.0 mg/dL   LDL Cholesterol 108 (H) 0 - 99 mg/dL   Total CHOL/HDL Ratio 5    NonHDL 147.96   TSH  Result Value Ref Range   TSH 2.26 0.35 - 4.50 uIU/mL  Hemoglobin A1c  Result Value Ref Range   Hgb A1c MFr Bld 6.9 (H) 4.6 - 6.5 %  Vitamin D (25 hydroxy)  Result Value Ref Range   VITD 44.97 30.00 - 100.00 ng/mL

## 2016-07-30 NOTE — Progress Notes (Signed)
Pre visit review using our clinic tool,if applicable. No additional management support is needed unless otherwise documented below in the visit note.  

## 2016-07-30 NOTE — Patient Instructions (Signed)
It was very nice to see you today!  Take care and I will be in touch with your labs asap We will refer you to a cardiologist here in [place] I refilled your Lorrin Mais- remember to use this as infrequently as you can to help avoid dependence.  Do not combine with alcohol or any sedating medications, do not take it and drive.

## 2016-07-31 LAB — COMPREHENSIVE METABOLIC PANEL
ALK PHOS: 68 U/L (ref 39–117)
ALT: 21 U/L (ref 0–35)
AST: 20 U/L (ref 0–37)
Albumin: 4.5 g/dL (ref 3.5–5.2)
BUN: 16 mg/dL (ref 6–23)
CHLORIDE: 105 meq/L (ref 96–112)
CO2: 28 mEq/L (ref 19–32)
Calcium: 9.9 mg/dL (ref 8.4–10.5)
Creatinine, Ser: 0.75 mg/dL (ref 0.40–1.20)
GFR: 100.65 mL/min (ref >60.00)
GLUCOSE: 109 mg/dL — AB (ref 70–99)
POTASSIUM: 3.7 meq/L (ref 3.5–5.1)
Sodium: 140 mEq/L (ref 135–145)
TOTAL PROTEIN: 7.3 g/dL (ref 6.0–8.3)
Total Bilirubin: 0.4 mg/dL (ref 0.2–1.2)

## 2016-07-31 LAB — CBC
HEMATOCRIT: 38.4 % (ref 36.0–46.0)
HEMOGLOBIN: 13.2 g/dL (ref 12.0–15.0)
MCHC: 34.3 g/dL (ref 30.0–36.0)
MCV: 88.2 fl (ref 78.0–100.0)
Platelets: 281 10*3/uL (ref 150.0–400.0)
RBC: 4.35 Mil/uL (ref 3.87–5.11)
RDW: 13.8 % (ref 11.5–15.5)
WBC: 6.5 10*3/uL (ref 4.0–10.5)

## 2016-07-31 LAB — LIPID PANEL
Cholesterol: 183 mg/dL (ref 0–200)
HDL: 35.3 mg/dL — AB (ref >39.00)
LDL CALC: 108 mg/dL — AB (ref 0–99)
NONHDL: 147.96
Total CHOL/HDL Ratio: 5
Triglycerides: 198 mg/dL — ABNORMAL HIGH (ref 0.0–149.0)
VLDL: 39.6 mg/dL (ref 0.0–40.0)

## 2016-07-31 LAB — VITAMIN D 25 HYDROXY (VIT D DEFICIENCY, FRACTURES): VITD: 44.97 ng/mL (ref 30.00–100.00)

## 2016-07-31 LAB — TSH: TSH: 2.26 u[IU]/mL (ref 0.35–4.50)

## 2016-07-31 LAB — HEMOGLOBIN A1C: Hgb A1c MFr Bld: 6.9 % — ABNORMAL HIGH (ref 4.6–6.5)

## 2016-08-01 ENCOUNTER — Encounter: Payer: Self-pay | Admitting: Family Medicine

## 2016-08-15 ENCOUNTER — Other Ambulatory Visit: Payer: Self-pay | Admitting: Family Medicine

## 2016-08-22 ENCOUNTER — Other Ambulatory Visit: Payer: Self-pay | Admitting: Emergency Medicine

## 2016-08-22 ENCOUNTER — Other Ambulatory Visit: Payer: Self-pay | Admitting: Family Medicine

## 2016-08-22 MED ORDER — TIZANIDINE HCL 4 MG PO CAPS: 4.0000 mg | ORAL_CAPSULE | Freq: Every day | ORAL | 1 refills | Status: DC | PRN

## 2016-08-22 NOTE — Telephone Encounter (Signed)
Called her- she takes this on occasion for neck spasm- maybe once or twice a month

## 2016-08-22 NOTE — Telephone Encounter (Signed)
Caller name: Relationship to patient: Self Can be reached: [phone]  Pharmacy:  [facility] [place] [phone] (Phone) [phone] (Fax)     Reason for call: Request refill on Tizanidine 5mg . States she has been taking this for a while but forgot to mention it when seeing provider

## 2016-09-05 DIAGNOSIS — I10 Essential (primary) hypertension: Secondary | ICD-10-CM | POA: Diagnosis not present

## 2016-09-05 DIAGNOSIS — E7800 Pure hypercholesterolemia, unspecified: Secondary | ICD-10-CM | POA: Diagnosis not present

## 2016-09-05 DIAGNOSIS — I2510 Atherosclerotic heart disease of native coronary artery without angina pectoris: Secondary | ICD-10-CM | POA: Diagnosis not present

## 2016-09-05 DIAGNOSIS — I252 Old myocardial infarction: Secondary | ICD-10-CM | POA: Diagnosis not present

## 2016-10-21 ENCOUNTER — Telehealth: Payer: Self-pay | Admitting: Family Medicine

## 2016-10-21 NOTE — Telephone Encounter (Signed)
Relation to pt: self  Call back [phone] Pharmacy: [facility] [place] [phone] (Phone) [phone] (Fax)     Reason for call:  Patient requesting a refill telmisartan (MICARDIS) 40 MG tablet and hydrochlorothiazide (HYDRODIURIL) 25 MG tablet

## 2016-10-22 ENCOUNTER — Other Ambulatory Visit: Payer: Self-pay | Admitting: Emergency Medicine

## 2016-10-22 MED ORDER — HYDROCHLOROTHIAZIDE 25 MG PO TABS: 25.0000 mg | ORAL_TABLET | Freq: Every day | ORAL | 1 refills | Status: DC

## 2016-10-22 MED ORDER — TELMISARTAN 40 MG PO TABS: 40.0000 mg | ORAL_TABLET | Freq: Every day | ORAL | 1 refills | Status: DC

## 2016-10-22 NOTE — Telephone Encounter (Signed)
Refill sent to pharmacy per pt request.

## 2017-02-19 DIAGNOSIS — E7800 Pure hypercholesterolemia, unspecified: Secondary | ICD-10-CM | POA: Insufficient documentation

## 2017-02-19 HISTORY — DX: Pure hypercholesterolemia, unspecified: E78.00

## 2017-02-26 ENCOUNTER — Telehealth: Payer: Self-pay | Admitting: Family Medicine

## 2017-02-26 DIAGNOSIS — I10 Essential (primary) hypertension: Secondary | ICD-10-CM | POA: Diagnosis not present

## 2017-02-26 DIAGNOSIS — E7800 Pure hypercholesterolemia, unspecified: Secondary | ICD-10-CM | POA: Diagnosis not present

## 2017-02-26 DIAGNOSIS — I2510 Atherosclerotic heart disease of native coronary artery without angina pectoris: Secondary | ICD-10-CM | POA: Diagnosis not present

## 2017-02-26 DIAGNOSIS — I252 Old myocardial infarction: Secondary | ICD-10-CM | POA: Diagnosis not present

## 2017-02-26 NOTE — Telephone Encounter (Signed)
[facility] called and requested to have a order place for 1139 code**- Left Additional Memorgram in epic for this patient this is coming in tomorrow.  Please call patient at [phone]

## 2017-02-26 NOTE — Telephone Encounter (Signed)
Called number trying to reach pt- was actually breast imaging center.  They need me to send order for diagnostic Mammo left, [place] if needed, 3D if needed I typed up this order and will fax to [phone]

## 2017-02-27 DIAGNOSIS — N6489 Other specified disorders of breast: Secondary | ICD-10-CM | POA: Diagnosis not present

## 2017-02-27 DIAGNOSIS — R928 Other abnormal and inconclusive findings on diagnostic imaging of breast: Secondary | ICD-10-CM | POA: Diagnosis not present

## 2017-03-04 NOTE — Telephone Encounter (Addendum)
Received Physician Orders from [facility]; forwarded to provider for signature, then faxed to the [facility] 11/21

## 2017-05-23 ENCOUNTER — Other Ambulatory Visit: Payer: Self-pay | Admitting: Family Medicine

## 2017-08-26 ENCOUNTER — Other Ambulatory Visit: Payer: Self-pay | Admitting: Family Medicine

## 2017-08-26 DIAGNOSIS — E785 Hyperlipidemia, unspecified: Secondary | ICD-10-CM

## 2017-09-23 ENCOUNTER — Other Ambulatory Visit: Payer: Self-pay | Admitting: Family Medicine

## 2017-09-23 DIAGNOSIS — E785 Hyperlipidemia, unspecified: Secondary | ICD-10-CM

## 2017-10-17 DIAGNOSIS — M7050 Other bursitis of knee, unspecified knee: Secondary | ICD-10-CM | POA: Diagnosis not present

## 2017-10-24 ENCOUNTER — Other Ambulatory Visit: Payer: Self-pay | Admitting: Family Medicine

## 2017-10-24 DIAGNOSIS — E785 Hyperlipidemia, unspecified: Secondary | ICD-10-CM

## 2017-11-23 ENCOUNTER — Other Ambulatory Visit: Payer: Self-pay | Admitting: Family Medicine

## 2017-12-03 DIAGNOSIS — M170 Bilateral primary osteoarthritis of knee: Secondary | ICD-10-CM | POA: Diagnosis not present

## 2017-12-03 DIAGNOSIS — R52 Pain, unspecified: Secondary | ICD-10-CM | POA: Diagnosis not present

## 2017-12-29 NOTE — Progress Notes (Addendum)
[place] at [facility] [address] [place], [place] [address] [phone]  Date:  12/30/2017   Name:  Tiffany Powers   DOB:  [birth year]   MRN:  [id number]  PCP:  Darreld Mclean, MD    Chief Complaint: Annual Exam (rash on neck, has had flu shot)   History of Present Illness:  Tiffany Powers is a 62 y.o. very pleasant female patient who presents with the following:  CPE today- I saw her once so far in April of 2018, did not come back for follow- up of elevated A1c as asked,  Her last A1c was in diabetes range as follows:  . Lab Results  Component Value Date   HGBA1C 6.9 (H) 07/30/2016    History of MI, hyperlipidemia, HTN She had an NSTEMI in 2017 Per most recent cardiology note 11/18: Reason for Visit: CAD Assessment/Plan  1. Coronary artery disease involving native coronary artery of native heart without angina pectoris  2. History of MI (myocardial infarction) k 3. Hypercholesteremia  4. Essential hypertension  Doing well from a cardiac standpoint Recent cardiac tests reviewed and summarized Outside records reviewed in Care everywhere Change medical therapy: add zetia, will need to consider PCSK9 Increased exercise recommended  Due for labs today- she is fasting today Pap: 2 years ago, never had an abnl Mammo: done in November at outside facility  Colon: done in 2014, given a 10 year follow-up.  This was done per [facility] - we will try and get this report  Immun: does flu at work  Tetanus is due, she got a Tdap in 2008 per her prior PCP   Her mom and sister will be moving in with her.  She will need to hire help to watch them during the day.   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  Her mother is still safe in her own home, but she does need close supervision.  Her sister also has mild dementia at a young age She is not taking aspirin on the advice of cardiology- she does take plavix at this time She has not needed to use her nitro She would like for [place] to do her OB-GYN care which is fine.   She last had labs in the fall through wellness program for her work She is not fasting right now but would like to go ahead and get labs today She is a never smoker She has used [place] in the past for periodic insomnia.  Wonders if she might go back on this for prn use.  She finds that some nights she will worry about her family and lie awake for hours.  However she is not experiencing  depression.  She has used ambien safely in the past Patient Active Problem List   Diagnosis Date Noted  . Hyperlipidemia 07/30/2016  . 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  . NSTEMI (non-ST elevated myocardial infarction) ([place]) 12/07/2015    Past Medical History:  Diagnosis Date  . High cholesterol   . Hypertension   . MI (myocardial infarction) ([place]) 2001  . NSTEMI (non-ST elevated myocardial infarction) ([place]) 12/07/2015    Past Surgical History:  Procedure Laterality Date  . CARDIAC CATHETERIZATION  2001; 2002; 12/06/2105  . CARDIAC CATHETERIZATION N/A 12/07/2015   Procedure: Left Heart Cath and Coronary Angiography;  Surgeon: [name] Prows, MD;  Location: [facility];  Service: Cardiovascular;  Laterality: N/A;  . TUBAL LIGATION  1980s    Social History   Tobacco Use  .  Smoking status: Never Smoker  . Smokeless tobacco: Never Used  Substance Use Topics  . Alcohol use: Not on file  . Drug use: No    Family History  Problem Relation Age of Onset  . CAD Father 36       MI  . CAD Brother 47       MI  . CAD Maternal Grandmother 65       CABG    No Known Allergies  Medication list has been reviewed and updated.  Current Outpatient Medications on File Prior to Visit  Medication Sig Dispense Refill  . aspirin EC 81 MG tablet Take 81 mg by mouth daily.    . Cyanocobalamin (VITAMIN B-12 PO) Take 1 tablet by mouth daily.    . hydrochlorothiazide (HYDRODIURIL) 25 MG tablet TAKE 1 TABLET BY MOUTH EVERY DAY 90 tablet 1  . nitroGLYCERIN (NITROSTAT) 0.4 MG SL tablet Place 1 tablet (0.4 mg total) under the tongue every 5 (five) minutes as needed for chest pain. 25 tablet 1  . rosuvastatin (CRESTOR) 40 MG tablet TAKE 1 TABLET BY MOUTH EVERY DAY. **NEED O/V FOR REFILLS** 14 tablet 0  . telmisartan (MICARDIS) 40 MG tablet TAKE 1 TABLET BY MOUTH EVERY DAY 90 tablet 1  . carvedilol (COREG) 6.25 MG tablet Take 1 tablet (6.25 mg total) by mouth 2 (two) times daily with a meal. (Patient not taking: Reported on 12/30/2017) 60 tablet 1  . clopidogrel (PLAVIX) 75 MG tablet Take 1 tablet (75 mg total) by mouth daily. (Patient not taking: Reported on 12/30/2017) 30 tablet 1  . zolpidem (AMBIEN) 5 MG tablet Take 1 tablet (5 mg total) by mouth at bedtime as needed for sleep. (Patient not taking: Reported on 12/30/2017) 30 tablet 1   No current facility-administered medications on file prior to visit.     Review of Systems:  As per HPI- otherwise negative. No fever or chills Not using nitro but needs more as hers has disintegrated   Physical Examination: Vitals:   12/30/17 1316  BP: 128/80  Pulse: 67  Resp: 16  Temp: 97.7 F (36.5 C)  SpO2: 98%   Vitals:   12/30/17 1316  Weight: 152 lb (68.9 kg)  Height: 5\' 2"  (1.575 m)   Body mass index is 27.8 kg/m. Ideal  Body Weight: Weight in (lb) to have BMI = 25: 136.4  GEN: WDWN, NAD, Non-toxic, A & O x 3, overweight, looks well  HEENT: Atraumatic, Normocephalic. Neck supple. No masses, No LAD. Ears and Nose: No external deformity. CV: RRR, No M/G/R. No JVD. No thrill. No extra heart sounds. PULM: CTA B, no wheezes, crackles, rhonchi. No retractions. No resp. distress. No accessory  muscle use. ABD: S, NT, ND EXTR: No c/c/e NEURO Normal gait.  PSYCH: Normally interactive. Conversant. Not depressed or anxious appearing.  Calm demeanor.  Rash on her neck is likely acanthosis nigricans  Assessment and Plan: Physical exam  History of MI (myocardial infarction) - Plan: hydrochlorothiazide (HYDRODIURIL) 25 MG tablet, telmisartan (MICARDIS) 40 MG tablet, nitroGLYCERIN (NITROSTAT) 0.4 MG SL tablet, clopidogrel (PLAVIX) 75 MG tablet  Adjustment insomnia - Plan: zolpidem (AMBIEN) 5 MG tablet  Screening for diabetes mellitus - Plan: Comprehensive metabolic panel, Hemoglobin A1c  Hyperlipidemia, unspecified hyperlipidemia type - Plan: Lipid panel, rosuvastatin (CRESTOR) 40 MG tablet  Screening for deficiency anemia - Plan: CBC  Screening for breast cancer  Encounter for hepatitis C screening test for low risk patient - Plan: Hepatitis C antibody  Immunization due - Plan: Td vaccine greater than or equal to 7yo preservative free IM  Muscle spasm - Plan: tiZANidine (ZANAFLEX) 4 MG capsule, tiZANidine (ZANAFLEX) 4 MG tablet  CPE today Pap next time Labs pending as above Gave rx for zanaflex tablets so she can halve dose when desired Ok to use [place] on occasion as she is now  BP is under ok control Start back on plavix She will see her cardiologist for follow-up   Signed Lamar Blinks, MD  Received her labs 9/19 Message to pt Also she had reminded me about an rx for the rash on her neck   Results for orders placed or performed in visit on 12/30/17  CBC  Result Value Ref Range   WBC 7.6 4.0  - 10.5 K/uL   RBC 4.63 3.87 - 5.11 Mil/uL   Platelets 268.0 150.0 - 400.0 K/uL   Hemoglobin 13.7 12.0 - 15.0 g/dL   HCT 40.0 36.0 - 46.0 %   MCV 86.4 78.0 - 100.0 fl   MCHC 34.4 30.0 - 36.0 g/dL   RDW 13.9 11.5 - 15.5 %  Comprehensive metabolic panel  Result Value Ref Range   Sodium 137 135 - 145 mEq/L   Potassium 3.8 3.5 - 5.1 mEq/L   Chloride 98 96 - 112 mEq/L   CO2 27 19 - 32 mEq/L   Glucose, Bld 93 70 - 99 mg/dL   BUN 14 6 - 23 mg/dL   Creatinine, Ser 0.82 0.40 - 1.20 mg/dL   Total Bilirubin 0.7 0.2 - 1.2 mg/dL   Alkaline Phosphatase 69 39 - 117 U/L   AST 24 0 - 37 U/L   ALT 24 0 - 35 U/L   Total Protein 7.5 6.0 - 8.3 g/dL   Albumin 4.7 3.5 - 5.2 g/dL   Calcium 10.1 8.4 - 10.5 mg/dL   GFR 90.39 >60.00 mL/min  Hemoglobin A1c  Result Value Ref Range   Hgb A1c MFr Bld 6.9 (H) 4.6 - 6.5 %  Lipid panel  Result Value Ref Range   Cholesterol 118 0 - 200 mg/dL   Triglycerides 104.0 0.0 - 149.0 mg/dL   HDL 34.10 (L) >39.00 mg/dL   VLDL 20.8 0.0 - 40.0 mg/dL   LDL Cholesterol 63 0 - 99 mg/dL   Total CHOL/HDL Ratio 3    NonHDL 83.76    I sent in a steroid cream for you to use for your neck- thanks for reminding me!   Blood counts are normal Metabolic profile normal Cholesterol is overall ok, except your HDL (good cholesterol) is low.  An omega 3 supplement and exercise will help here!  Your A1c is again in the diabetes range- this does confirm the  diagnosis of diabetes I'm afraid. That being said, your A1c is not terribly high, which means that your sugar is not way out of control.  I would recommend an oral medication called metformin to help bring your sugar down. Would it be ok with you if I send in this rx? Then would like to visit in 1-2 months in the office to discuss diabetes in further detail  Take care

## 2017-12-30 ENCOUNTER — Encounter: Payer: Self-pay | Admitting: Family Medicine

## 2017-12-30 ENCOUNTER — Ambulatory Visit (INDEPENDENT_AMBULATORY_CARE_PROVIDER_SITE_OTHER): Payer: BLUE CROSS/BLUE SHIELD | Admitting: Family Medicine

## 2017-12-30 VITALS — BP 128/80 | HR 67 | Temp 97.7°F | Resp 16 | Ht 62.0 in | Wt 152.0 lb

## 2017-12-30 DIAGNOSIS — Z1159 Encounter for screening for other viral diseases: Secondary | ICD-10-CM | POA: Diagnosis not present

## 2017-12-30 DIAGNOSIS — Z0000 Encounter for general adult medical examination without abnormal findings: Secondary | ICD-10-CM | POA: Diagnosis not present

## 2017-12-30 DIAGNOSIS — Z130 Encounter for screening for diseases of the blood and blood-forming organs and certain disorders involving the immune mechanism: Secondary | ICD-10-CM

## 2017-12-30 DIAGNOSIS — E785 Hyperlipidemia, unspecified: Secondary | ICD-10-CM | POA: Diagnosis not present

## 2017-12-30 DIAGNOSIS — I252 Old myocardial infarction: Secondary | ICD-10-CM | POA: Diagnosis not present

## 2017-12-30 DIAGNOSIS — Z131 Encounter for screening for diabetes mellitus: Secondary | ICD-10-CM | POA: Diagnosis not present

## 2017-12-30 DIAGNOSIS — F5102 Adjustment insomnia: Secondary | ICD-10-CM

## 2017-12-30 DIAGNOSIS — Z23 Encounter for immunization: Secondary | ICD-10-CM | POA: Diagnosis not present

## 2017-12-30 DIAGNOSIS — E119 Type 2 diabetes mellitus without complications: Secondary | ICD-10-CM

## 2017-12-30 DIAGNOSIS — Z1239 Encounter for other screening for malignant neoplasm of breast: Secondary | ICD-10-CM

## 2017-12-30 DIAGNOSIS — Z1231 Encounter for screening mammogram for malignant neoplasm of breast: Secondary | ICD-10-CM

## 2017-12-30 DIAGNOSIS — M62838 Other muscle spasm: Secondary | ICD-10-CM

## 2017-12-30 DIAGNOSIS — R21 Rash and other nonspecific skin eruption: Secondary | ICD-10-CM

## 2017-12-30 MED ORDER — CLOPIDOGREL BISULFATE 75 MG PO TABS: 75.0000 mg | ORAL_TABLET | Freq: Every day | ORAL | 3 refills | Status: DC

## 2017-12-30 MED ORDER — ZOLPIDEM TARTRATE 5 MG PO TABS: 5.0000 mg | ORAL_TABLET | Freq: Every evening | ORAL | 1 refills | Status: DC | PRN

## 2017-12-30 MED ORDER — TIZANIDINE HCL 4 MG PO TABS: ORAL_TABLET | ORAL | 1 refills | Status: DC

## 2017-12-30 MED ORDER — ROSUVASTATIN CALCIUM 40 MG PO TABS: ORAL_TABLET | ORAL | 3 refills | Status: DC

## 2017-12-30 MED ORDER — HYDROCHLOROTHIAZIDE 25 MG PO TABS: 25.0000 mg | ORAL_TABLET | Freq: Every day | ORAL | 3 refills | Status: DC

## 2017-12-30 MED ORDER — NITROGLYCERIN 0.4 MG SL SUBL: 0.4000 mg | SUBLINGUAL_TABLET | SUBLINGUAL | 1 refills | Status: DC | PRN

## 2017-12-30 MED ORDER — TIZANIDINE HCL 4 MG PO CAPS: 4.0000 mg | ORAL_CAPSULE | Freq: Three times a day (TID) | ORAL | 2 refills | Status: DC | PRN

## 2017-12-30 MED ORDER — TELMISARTAN 40 MG PO TABS: 40.0000 mg | ORAL_TABLET | Freq: Every day | ORAL | 3 refills | Status: DC

## 2017-12-30 NOTE — Patient Instructions (Signed)
Good to see you today!  I will be in touch with your labs asap I sent in refills as below and will try to get your last colonoscopy report You got your tetanus booster today- flu shot to be done at work  Remember not to use the muscle relaxer and ambien on the same day

## 2017-12-31 ENCOUNTER — Encounter: Payer: Self-pay | Admitting: Family Medicine

## 2017-12-31 DIAGNOSIS — E119 Type 2 diabetes mellitus without complications: Secondary | ICD-10-CM

## 2017-12-31 HISTORY — DX: Type 2 diabetes mellitus without complications: E11.9

## 2017-12-31 LAB — CBC
HEMATOCRIT: 40 % (ref 36.0–46.0)
Hemoglobin: 13.7 g/dL (ref 12.0–15.0)
MCHC: 34.4 g/dL (ref 30.0–36.0)
MCV: 86.4 fl (ref 78.0–100.0)
Platelets: 268 10*3/uL (ref 150.0–400.0)
RBC: 4.63 Mil/uL (ref 3.87–5.11)
RDW: 13.9 % (ref 11.5–15.5)
WBC: 7.6 10*3/uL (ref 4.0–10.5)

## 2017-12-31 LAB — LIPID PANEL
CHOL/HDL RATIO: 3
Cholesterol: 118 mg/dL (ref 0–200)
HDL: 34.1 mg/dL — AB (ref >39.00)
LDL CALC: 63 mg/dL (ref 0–99)
NONHDL: 83.76
Triglycerides: 104 mg/dL (ref 0.0–149.0)
VLDL: 20.8 mg/dL (ref 0.0–40.0)

## 2017-12-31 LAB — COMPREHENSIVE METABOLIC PANEL
ALBUMIN: 4.7 g/dL (ref 3.5–5.2)
ALT: 24 U/L (ref 0–35)
AST: 24 U/L (ref 0–37)
Alkaline Phosphatase: 69 U/L (ref 39–117)
BUN: 14 mg/dL (ref 6–23)
CHLORIDE: 98 meq/L (ref 96–112)
CO2: 27 mEq/L (ref 19–32)
Calcium: 10.1 mg/dL (ref 8.4–10.5)
Creatinine, Ser: 0.82 mg/dL (ref 0.40–1.20)
GFR: 90.39 mL/min (ref >60.00)
GLUCOSE: 93 mg/dL (ref 70–99)
POTASSIUM: 3.8 meq/L (ref 3.5–5.1)
SODIUM: 137 meq/L (ref 135–145)
Total Bilirubin: 0.7 mg/dL (ref 0.2–1.2)
Total Protein: 7.5 g/dL (ref 6.0–8.3)

## 2017-12-31 LAB — HEMOGLOBIN A1C: HEMOGLOBIN A1C: 6.9 % — AB (ref 4.6–6.5)

## 2017-12-31 LAB — HEPATITIS C ANTIBODY
HEP C AB: NONREACTIVE
SIGNAL TO CUT-OFF: 0.02 (ref <1.00)

## 2017-12-31 MED ORDER — TRIAMCINOLONE ACETONIDE 0.1 % EX CREA: 1.0000 "application " | TOPICAL_CREAM | Freq: Two times a day (BID) | CUTANEOUS | 0 refills | Status: DC

## 2017-12-31 MED ORDER — METFORMIN HCL 500 MG PO TABS: 500.0000 mg | ORAL_TABLET | Freq: Every day | ORAL | 3 refills | Status: DC

## 2017-12-31 NOTE — Addendum Note (Signed)
Addended by: Lamar Blinks C on: 12/31/2017 01:07 PM   Modules accepted: Orders

## 2018-01-26 ENCOUNTER — Telehealth: Payer: BLUE CROSS/BLUE SHIELD | Admitting: Physician Assistant

## 2018-01-26 DIAGNOSIS — R05 Cough: Secondary | ICD-10-CM

## 2018-01-26 DIAGNOSIS — R058 Other specified cough: Secondary | ICD-10-CM

## 2018-01-26 MED ORDER — BENZONATATE 100 MG PO CAPS: 100.0000 mg | ORAL_CAPSULE | Freq: Three times a day (TID) | ORAL | 0 refills | Status: DC | PRN

## 2018-01-26 NOTE — Progress Notes (Signed)

## 2018-02-25 DIAGNOSIS — H527 Unspecified disorder of refraction: Secondary | ICD-10-CM | POA: Diagnosis not present

## 2018-02-25 DIAGNOSIS — H43811 Vitreous degeneration, right eye: Secondary | ICD-10-CM | POA: Diagnosis not present

## 2018-02-25 DIAGNOSIS — H25813 Combined forms of age-related cataract, bilateral: Secondary | ICD-10-CM | POA: Diagnosis not present

## 2018-02-25 DIAGNOSIS — E113293 Type 2 diabetes mellitus with mild nonproliferative diabetic retinopathy without macular edema, bilateral: Secondary | ICD-10-CM | POA: Diagnosis not present

## 2018-03-01 DIAGNOSIS — I252 Old myocardial infarction: Secondary | ICD-10-CM | POA: Diagnosis not present

## 2018-03-01 DIAGNOSIS — I10 Essential (primary) hypertension: Secondary | ICD-10-CM | POA: Diagnosis not present

## 2018-03-01 DIAGNOSIS — I2510 Atherosclerotic heart disease of native coronary artery without angina pectoris: Secondary | ICD-10-CM | POA: Diagnosis not present

## 2018-03-01 DIAGNOSIS — E7800 Pure hypercholesterolemia, unspecified: Secondary | ICD-10-CM | POA: Diagnosis not present

## 2018-03-30 ENCOUNTER — Encounter: Payer: Self-pay | Admitting: Family Medicine

## 2018-03-30 NOTE — Progress Notes (Signed)
Tiffany Powers at [facility] [address] [place], [place] [address] [phone]  Date:  04/01/2018   Name:  Tiffany Powers   DOB:  December 24, 1955   MRN:  [id number]  PCP:  Darreld Mclean, MD    Chief Complaint: Anxiety (one month, stressful situation)   History of Present Illness:  Tiffany Powers is a 62 y.o. very pleasant female patient who presents with the following:  Here today to discuss anxiety.  History of hyperlipidemia, NSTEMI and DM Lab Results  Component Value Date   HGBA1C 6.9 (H) 12/30/2017    We discussed some life stressors at her recent physical in September of this year Her mom and sister will be moving in with her.  She will need to hire help to watch them during the day.   This is stressful but her mood is ok She is not sleeping that great as she is trying to move to a new home soon, one that is larger to accommodate everyone  NCCSR: last filled ambien in October She uses [place] on occasion for insomnia- she estimates once a week   Her mom and her sister have moved in as of the last 4 weeks.  This has turned out to be a lot harder than she had thought.   She is still working a lot of hours, her job in [employer]bal[employer] is very hectic this time of year. Her daughter works from home, and she keeps the two during the day Her granddaughter stays in the home as well, and is a big help.  However Tiffany Powers hesitates to asked to much of her, as her granddaughter is young and she wants her to enjoy her life.  Her sister is not able to take her own showers any longer.  We think that she has dementia though she has not yet formally been diagnosed Her mother has dementia, neuropathy and recently got a foot infection Her mother also brought her dog who is not house trained  thankfully the mom and sister are sleeping well at night  Tiffany Powers is feeling overwhelmed.  In the morning she gets her mother and sister ready for the day, then try  some to her daughter's house.  She works hard all day, then takes her mom and sister up on the way home and takes care of them in the evening.  She really has no time for herself.  She has been looking into some resources to assist in their care, and has hope that she will be able to get some help.  However in the meantime she is interested in taking some medication for anxiety and depression.  She denies any suicidal ideation.  She did take celexa a few years ago and did ok with this-however she did not find that it was all that effective. She also used some xanax when her brother died- used for a brief period   History of NSTEMI in 2017 She is on aspirin and Plavix  Asa 81 plavix hctz 25 Metformin crestor micradis ambien - less than once a week   Patient Active Problem List   Diagnosis Date Noted  . Controlled type 2 diabetes mellitus without complication, without long-term current use of insulin ([place]) 12/31/2017  . Hyperlipidemia 07/30/2016  . 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  . NSTEMI (non-ST elevated myocardial infarction) ([place]) 12/07/2015    Past Medical History:  Diagnosis Date  . High cholesterol   . Hypertension   .  MI (myocardial infarction) ([place]) 2001  . NSTEMI (non-ST elevated myocardial infarction) ([place]) 12/07/2015    Past Surgical History:  Procedure Laterality Date  . CARDIAC CATHETERIZATION  2001; 2002; 12/06/2105  . CARDIAC CATHETERIZATION N/A 12/07/2015   Procedure: Left Heart Cath and Coronary Angiography;  Surgeon: Tiffany Powers Prows, MD;  Location: [facility];  Service: Cardiovascular;  Laterality: N/A;  . TUBAL LIGATION  1980s    Social History   Tobacco Use  . Smoking status: Never Smoker  . Smokeless tobacco: Never Used  Substance Use Topics  . Alcohol use: Not on file  . Drug use: No    Family History  Problem Relation Age of Onset  . CAD Father 30       MI  . CAD Brother 17       MI  . CAD Maternal Grandmother 65       CABG    No Known  Allergies  Medication list has been reviewed and updated.  Current Outpatient Medications on File Prior to Visit  Medication Sig Dispense Refill  . aspirin EC 81 MG tablet Take 81 mg by mouth daily.    . clopidogrel (PLAVIX) 75 MG tablet Take 1 tablet (75 mg total) by mouth daily. 90 tablet 3  . Cyanocobalamin (VITAMIN B-12 PO) Take 1 tablet by mouth daily.    . hydrochlorothiazide (HYDRODIURIL) 25 MG tablet Take 1 tablet (25 mg total) by mouth daily. 90 tablet 3  . metFORMIN (GLUCOPHAGE) 500 MG tablet Take 1 tablet (500 mg total) by mouth daily. 90 tablet 3  . nitroGLYCERIN (NITROSTAT) 0.4 MG SL tablet Place 1 tablet (0.4 mg total) under the tongue every 5 (five) minutes as needed for chest pain. 25 tablet 1  . rosuvastatin (CRESTOR) 40 MG tablet TAKE 1 TABLET BY MOUTH EVERY DAY. 90 tablet 3  . telmisartan (MICARDIS) 40 MG tablet Take 1 tablet (40 mg total) by mouth daily. 90 tablet 3  . tiZANidine (ZANAFLEX) 4 MG tablet Take 1/2 or 1 every 8 hours as needed for muscles spasm 30 tablet 1  . triamcinolone cream (KENALOG) 0.1 % Apply 1 application topically 2 (two) times daily. Use as needed for rash on neck 45 g 0  . zolpidem (AMBIEN) 5 MG tablet Take 1 tablet (5 mg total) by mouth at bedtime as needed for sleep. 30 tablet 1   No current facility-administered medications on file prior to visit.     Review of Systems:  As per HPI- otherwise negative.    Physical Examination: Vitals:   04/01/18 1032  BP: 136/80  Pulse: 67  Resp: 16  Temp: 97.7 F (36.5 C)  SpO2: 98%   Vitals:   04/01/18 1032  Weight: 152 lb (68.9 kg)  Height: 5\' 2"  (1.575 m)   Body mass index is 27.8 kg/m. Ideal Body Weight: Weight in (lb) to have BMI = 25: 136.4  GEN: WDWN, NAD, Non-toxic, A & O x 3, overweight, looks well  HEENT: Atraumatic, Normocephalic. Neck supple. No masses, No LAD. Ears and Nose: No external deformity. CV: RRR, No M/G/R. No JVD. No thrill. No extra heart sounds. PULM: CTA B, no  wheezes, crackles, rhonchi. No retractions. No resp. distress. No accessory muscle use. EXTR: No c/c/e NEURO Normal gait.  PSYCH: Normally interactive. Conversant. Not depressed or anxious appearing.  Calm demeanor.    Assessment and Plan: Caregiver stress  Adjustment disorder with mixed anxiety and depressed mood - Plan: venlafaxine (EFFEXOR) 75 MG tablet, ALPRAZolam Duanne Moron)  0.25 MG tablet  Makita is here today to discuss caregiver stress and adjustment reaction.  Her mother and sister, both of whom have dementia, which into her home about 1 month ago.  This is turned out to be harder than she had anticipated.  And had noticed that last weekend she had "a meltdown" and was crying and got very upset.  She is also been fighting with her daughter as they try to share the burden of caring for  family members.  She is interested in going on both a maintenance medication for depression, and a short acting anxiolytic.  We will start her on Effexor XR at 75 mg.  Advised her that we may need to increase the dose, we will see how she responds.  Also gave a small supply of Xanax to use as needed, cautioned that this can be sedating, habit-forming, and should not combine with Ambien. There is potential increased bleeding risk in using Effexor along with aspirin Plavix.  Have discussed this with the patient, and she will watch for any signs of bleeding.  This seems to be the case with several other antidepressants which I also checked for interactions.  Asked her to update me via my chart in 3 to 4 weeks, sooner if she is not doing okay Signed Lamar Blinks, MD

## 2018-04-01 ENCOUNTER — Ambulatory Visit: Payer: BLUE CROSS/BLUE SHIELD | Admitting: Family Medicine

## 2018-04-01 ENCOUNTER — Encounter: Payer: Self-pay | Admitting: Family Medicine

## 2018-04-01 VITALS — BP 136/80 | HR 67 | Temp 97.7°F | Resp 16 | Ht 62.0 in | Wt 152.0 lb

## 2018-04-01 DIAGNOSIS — F4323 Adjustment disorder with mixed anxiety and depressed mood: Secondary | ICD-10-CM

## 2018-04-01 DIAGNOSIS — Z636 Dependent relative needing care at home: Secondary | ICD-10-CM

## 2018-04-01 MED ORDER — ALPRAZOLAM 0.25 MG PO TABS: 0.2500 mg | ORAL_TABLET | Freq: Two times a day (BID) | ORAL | 0 refills | Status: DC | PRN

## 2018-04-01 MED ORDER — VENLAFAXINE HCL 75 MG PO TABS: 75.0000 mg | ORAL_TABLET | Freq: Two times a day (BID) | ORAL | 3 refills | Status: DC

## 2018-04-01 NOTE — Patient Instructions (Addendum)
It was good to see you today, but I am sorry you are under so much pressure right now.  You have really taken on a lot! I would encourage you to use all available resources that you can find to assist you in caring for your family members.  Please do not feel that you have to do this all yourself, because it is really impossible. To help with your symptoms, we will try a couple of medications.  Please start on Effexor XR 75 mg once a day. You may also use the Xanax as needed, however use this as little as you can.  It can make you drowsy.  Do not combine Xanax with Ambien. Please send me a my chart message in 3 to 4 weeks, and let me know how you are doing.  However if you are not doing okay, please let me know sooner.

## 2019-01-31 ENCOUNTER — Other Ambulatory Visit: Payer: Self-pay

## 2019-01-31 DIAGNOSIS — I252 Old myocardial infarction: Secondary | ICD-10-CM

## 2019-01-31 DIAGNOSIS — E785 Hyperlipidemia, unspecified: Secondary | ICD-10-CM

## 2019-01-31 MED ORDER — ROSUVASTATIN CALCIUM 40 MG PO TABS: ORAL_TABLET | ORAL | 0 refills | Status: DC

## 2019-01-31 MED ORDER — CLOPIDOGREL BISULFATE 75 MG PO TABS: 75.0000 mg | ORAL_TABLET | Freq: Every day | ORAL | 0 refills | Status: DC

## 2019-01-31 MED ORDER — TELMISARTAN 40 MG PO TABS: 40.0000 mg | ORAL_TABLET | Freq: Every day | ORAL | 0 refills | Status: DC

## 2019-02-24 ENCOUNTER — Encounter: Payer: BLUE CROSS/BLUE SHIELD | Admitting: Family Medicine

## 2019-02-26 ENCOUNTER — Other Ambulatory Visit: Payer: Self-pay | Admitting: Family Medicine

## 2019-02-26 DIAGNOSIS — E785 Hyperlipidemia, unspecified: Secondary | ICD-10-CM

## 2019-02-26 DIAGNOSIS — I252 Old myocardial infarction: Secondary | ICD-10-CM

## 2019-02-26 NOTE — Progress Notes (Addendum)
Abie at [facility] [address], [place], [place] [address] [phone]  Date:  02/28/2019   Name:  Tiffany Powers   DOB:  03/02/1956   MRN:  [id number]  PCP:  Darreld Mclean, MD    Chief Complaint: Annual Exam   History of Present Illness:  Tiffany Powers is a 63 y.o. very pleasant female patient who presents with the following:  Here today for routine physical-patient with history of diabetes, hyperlipidemia, NSTEMI 2017 Last seen by myself about 1 year ago-at that time her mother and sister had recently moved in with her.  She was under a lot of stress.  Her mother and sister both have dementia At that time we started now on Effexor XR, and Xanax as needed The whole group ended up moving into her mother's home.  This is helping some with financial pressures.   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  She is thinking of retiring next year  She is no loner taking the effexor- she took it just briefly but it did not agree with her.  Made her feel "spaced out" She feels like her mood is ok, but she has some trouble sleeping at night She may take xanax at bedtime prn I gave her 20 alprazolam about 1 year ago, she is still using the supply.  She is not taking Ambien  Her cardiologist is with [facility]- will see him next month  Will do labs for her to take with her then  Lab Results  Component Value Date   HGBA1C 6.9 (H) 02/28/2019   Foot exam- today Eye exam- done last fall  Pap- last done in 2016- will do today  Colon cancer screening- done in 2013, they gave her 10 years to follow-up. Done in [place]- she  does through [facility] and will schedule soon  Flu shot done She would like to start pneumonia series now before winter -she will turn 43 in February Would prefer to delay Shingrix Labs are due- she did not fast today, but she ate 5-6 hours ago   She stopped her metformin about 6 months ago due to SE- will check her A1c today She does check her glucose on occasion it seems to be okay She stopped asa as she is now on plavix per cardiology advice Never a smoker   Patient Active Problem List   Diagnosis Date Noted  . Controlled type 2 diabetes mellitus without complication, without long-term current use of insulin ([place]) 12/31/2017  . Hyperlipidemia 07/30/2016  . 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  . NSTEMI (non-ST elevated myocardial infarction) ([place]) 12/07/2015    Past Medical History:  Diagnosis Date  . High cholesterol   . Hypertension   . MI (myocardial infarction) ([place]) 2001  . NSTEMI (non-ST elevated myocardial infarction) ([place]) 12/07/2015    Past Surgical History:  Procedure Laterality Date  . CARDIAC CATHETERIZATION  2001; 2002; 12/06/2105  . CARDIAC CATHETERIZATION N/A 12/07/2015   Procedure: Left Heart Cath and Coronary Angiography;  Surgeon: [name] Prows, MD;  Location: [facility];  Service: Cardiovascular;  Laterality: N/A;  .  TUBAL LIGATION  1980s    Social History   Tobacco Use  . Smoking status: Never Smoker  . Smokeless tobacco: Never Used  Substance Use Topics  . Alcohol use: Not on file  . Drug use: No    Family History  Problem Relation Age of Onset  . CAD Father 78       MI  . CAD Brother 55       MI  . CAD Maternal Grandmother 65       CABG    No Known Allergies  Medication list has been reviewed and updated.  Current Outpatient Medications on File Prior to Visit  Medication Sig Dispense Refill  . Cyanocobalamin (VITAMIN B-12 PO) Take 1 tablet by mouth daily.    Mar[place] Kitchen tiZANidine (ZANAFLEX) 4 MG tablet Take 1/2 or 1 every 8 hours as needed for  muscles spasm 30 tablet 1  . triamcinolone cream (KENALOG) 0.1 % Apply 1 application topically 2 (two) times daily. Use as needed for rash on neck 45 g 0   No current facility-administered medications on file prior to visit.     Review of Systems:  As per HPI- otherwise negative. No chest pain or shortness of breath, no postmenopausal bleeding, no fever or chills  Physical Examination: Vitals:   02/28/19 1437  BP: 122/72  Pulse: 67  Resp: 16  Temp: (!) 96.4 F (35.8 C)  SpO2: 97%   Vitals:   02/28/19 1437  Weight: 156 lb (70.8 kg)  Height: 5\' 2"  (1.575 m)   Body mass index is 28.53 kg/m. Ideal Body Weight: Weight in (lb) to have BMI = 25: 136.4  GEN: WDWN, NAD, Non-toxic, A & O x 3, overweight, looks well HEENT: Atraumatic, Normocephalic. Neck supple. No masses, No LAD.  TM within normal limits Ears and Nose: No external deformity. CV: RRR, No M/G/R. No JVD. No thrill. No extra heart sounds. PULM: CTA B, no wheezes, crackles, rhonchi. No retractions. No resp. distress. No accessory muscle use. ABD: S, NT, ND, +BS. No rebound. No HSM. EXTR: No c/c/e NEURO Normal gait.  PSYCH: Normally interactive. Conversant. Not depressed or anxious appearing.  Calm demeanor.  Foot exam normal  Performed today.  Normal external genitalia, normal vagina, uterus, adnexa.  She does have a mild cystocele/vaginal prolapse, but is not bothered by this at all  Assessment and Plan: Physical exam  Controlled type 2 diabetes mellitus without complication, without long-term current use of insulin ([place]) - Plan: Comprehensive metabolic panel, Hemoglobin A1c, Microalbumin / creatinine urine ratio  History of MI (myocardial infarction) - Plan: clopidogrel (PLAVIX) 75 MG tablet, nitroGLYCERIN (NITROSTAT) 0.4 MG SL tablet, telmisartan (MICARDIS) 40 MG tablet, hydrochlorothiazide (HYDRODIURIL) 25 MG tablet  Hyperlipidemia, unspecified hyperlipidemia type - Plan: Lipid panel, rosuvastatin (CRESTOR) 40  MG tablet  Screening for deficiency anemia - Plan: CBC  Encounter for screening mammogram for malignant neoplasm of breast  Screening for thyroid disorder - Plan: TSH  Screening for HIV (human immunodeficiency virus) - Plan: HIV antibody  Immunization due - Plan: Pneumococcal (PPSV23) vaccine  Screening for cervical cancer - Plan: Cytology - PAP  Adjustment disorder with mixed anxiety and depressed mood - Plan: ALPRAZolam (XANAX) 0.25 MG tablet  Here today for complete physical Refilled her medications as above Pap, labs performed today Start shingles vaccine series Will plan further follow- up pending labs. Refilled her alprazolam to use as needed for occasional insomnia Signed Lamar Blinks, MD  Received her labs 11/17- message to pt  A1c stable   Results for orders placed or performed in visit on 02/28/19  CBC  Result Value Ref Range   WBC 6.9 4.0 - 10.5 K/uL   RBC 4.09 3.87 - 5.11 Mil/uL   Platelets 232.0 150.0 - 400.0 K/uL   Hemoglobin 12.2 12.0 - 15.0 g/dL   HCT 36.3 36.0 - 46.0 %   MCV 88.8 78.0 - 100.0 fl   MCHC 33.7 30.0 - 36.0 g/dL   RDW 14.1 11.5 - 15.5 %  Comprehensive metabolic panel  Result Value Ref Range   Sodium 142 135 - 145 mEq/L   Potassium 3.9 3.5 - 5.1 mEq/L   Chloride 108 96 - 112 mEq/L   CO2 26 19 - 32 mEq/L   Glucose, Bld 95 70 - 99 mg/dL   BUN 13 6 - 23 mg/dL   Creatinine, Ser 0.68 0.40 - 1.20 mg/dL   Total Bilirubin 0.4 0.2 - 1.2 mg/dL   Alkaline Phosphatase 53 39 - 117 U/L   AST 21 0 - 37 U/L   ALT 24 0 - 35 U/L   Total Protein 6.7 6.0 - 8.3 g/dL   Albumin 4.4 3.5 - 5.2 g/dL   GFR 105.16 >60.00 mL/min   Calcium 9.2 8.4 - 10.5 mg/dL  Hemoglobin A1c  Result Value Ref Range   Hgb A1c MFr Bld 6.9 (H) 4.6 - 6.5 %  Lipid panel  Result Value Ref Range   Cholesterol 126 0 - 200 mg/dL   Triglycerides 82.0 0.0 - 149.0 mg/dL   HDL 37.20 (L) >39.00 mg/dL   VLDL 16.4 0.0 - 40.0 mg/dL   LDL Cholesterol 72 0 - 99 mg/dL   Total  CHOL/HDL Ratio 3    NonHDL 88.42   HIV antibody  Result Value Ref Range   HIV 1&2 Ab, 4th Generation NON-REACTIVE NON-REACTI  TSH  Result Value Ref Range   TSH 2.58 0.35 - 4.50 uIU/mL  Microalbumin / creatinine urine ratio  Result Value Ref Range   Microalb, Ur <0.7 0.0 - 1.9 mg/dL   Creatinine,U 49.7 mg/dL   Microalb Creat Ratio 1.4 0.0 - 30.0 mg/g  Cytology - PAP  Result Value Ref Range   High risk HPV Negative    Adequacy      Satisfactory for evaluation; transformation zone component PRESENT.   Diagnosis      - Negative for intraepithelial lesion or malignancy (NILM)   Comment Normal Reference Range HPV - Negative   ' Received her pap 11/19- message to pt

## 2019-02-26 NOTE — Patient Instructions (Addendum)
It was great to see you again today, I will be in touch with your labs ASAP Please schedule your mammogram at your earliest convenience Please also consider getting the shingles vaccine at some point in the next year or so  I think you are due for your annual eye exam when you have time    Health Maintenance After Age 64 After age 42, you are at a higher risk for certain long-term diseases and infections as well as injuries from falls. Falls are a major cause of broken bones and head injuries in people who are older than age 17. Getting regular preventive care can help to keep you healthy and well. Preventive care includes getting regular testing and making lifestyle changes as recommended by your health care provider. Talk with your health care provider about:  Which screenings and tests you should have. A screening is a test that checks for a disease when you have no symptoms.  A diet and exercise plan that is right for you. What should I know about screenings and tests to prevent falls? Screening and testing are the best ways to find a health problem early. Early diagnosis and treatment give you the best chance of managing medical conditions that are common after age 16. Certain conditions and lifestyle choices may make you more likely to have a fall. Your health care provider may recommend:  Regular vision checks. Poor vision and conditions such as cataracts can make you more likely to have a fall. If you wear glasses, make sure to get your prescription updated if your vision changes.  Medicine review. Work with your health care provider to regularly review all of the medicines you are taking, including over-the-counter medicines. Ask your health care provider about any side effects that may make you more likely to have a fall. Tell your health care provider if any medicines that you take make you feel dizzy or sleepy.  Osteoporosis screening. Osteoporosis is a condition that causes the bones  to get weaker. This can make the bones weak and cause them to break more easily.  Blood pressure screening. Blood pressure changes and medicines to control blood pressure can make you feel dizzy.  Strength and balance checks. Your health care provider may recommend certain tests to check your strength and balance while standing, walking, or changing positions.  Foot health exam. Foot pain and numbness, as well as not wearing proper footwear, can make you more likely to have a fall.  Depression screening. You may be more likely to have a fall if you have a fear of falling, feel emotionally low, or feel unable to do activities that you used to do.  Alcohol use screening. Using too much alcohol can affect your balance and may make you more likely to have a fall. What actions can I take to lower my risk of falls? General instructions  Talk with your health care provider about your risks for falling. Tell your health care provider if: ? You fall. Be sure to tell your health care provider about all falls, even ones that seem minor. ? You feel dizzy, sleepy, or off-balance.  Take over-the-counter and prescription medicines only as told by your health care provider. These include any supplements.  Eat a healthy diet and maintain a healthy weight. A healthy diet includes low-fat dairy products, low-fat (lean) meats, and fiber from whole grains, beans, and lots of fruits and vegetables. Home safety  Remove any tripping hazards, such as rugs, cords, and clutter.  Install safety equipment such as grab bars in bathrooms and safety rails on stairs.  Keep rooms and walkways well-lit. Activity   Follow a regular exercise program to stay fit. This will help you maintain your balance. Ask your health care provider what types of exercise are appropriate for you.  If you need a cane or walker, use it as recommended by your health care provider.  Wear supportive shoes that have nonskid  soles. Lifestyle  Do not drink alcohol if your health care provider tells you not to drink.  If you drink alcohol, limit how much you have: ? 0-1 drink a day for women. ? 0-2 drinks a day for men.  Be aware of how much alcohol is in your drink. In the [place] one drink equals one typical bottle of beer (12 oz), one-half glass of wine (5 oz), or one shot of hard liquor (1 oz).  Do not use any products that contain nicotine or tobacco, such as cigarettes and e-cigarettes. If you need help quitting, ask your health care provider. Summary  Having a healthy lifestyle and getting preventive care can help to protect your health and wellness after age 34.  Screening and testing are the best way to find a health problem early and help you avoid having a fall. Early diagnosis and treatment give you the best chance for managing medical conditions that are more common for people who are older than age 37.  Falls are a major cause of broken bones and head injuries in people who are older than age 1. Take precautions to prevent a fall at home.  Work with your health care provider to learn what changes you can make to improve your health and wellness and to prevent falls. This information is not intended to replace advice given to you by your health care provider. Make sure you discuss any questions you have with your health care provider. Document Released: 02/11/2017 Document Revised: 07/22/2018 Document Reviewed: 02/11/2017 Elsevier Patient Education  2020 [employer].

## 2019-02-28 ENCOUNTER — Other Ambulatory Visit (HOSPITAL_COMMUNITY)
Admission: RE | Admit: 2019-02-28 | Discharge: 2019-02-28 | Disposition: A | Discharge status: Home / Self Care | Payer: BC Managed Care – PPO | Source: Ambulatory Visit | Attending: Family Medicine | Admitting: Family Medicine

## 2019-02-28 ENCOUNTER — Other Ambulatory Visit: Payer: Self-pay

## 2019-02-28 ENCOUNTER — Ambulatory Visit (INDEPENDENT_AMBULATORY_CARE_PROVIDER_SITE_OTHER): Payer: BC Managed Care – PPO | Admitting: Family Medicine

## 2019-02-28 ENCOUNTER — Encounter: Payer: Self-pay | Admitting: Family Medicine

## 2019-02-28 VITALS — BP 122/72 | HR 67 | Temp 96.4°F | Resp 16 | Ht 62.0 in | Wt 156.0 lb

## 2019-02-28 DIAGNOSIS — Z124 Encounter for screening for malignant neoplasm of cervix: Secondary | ICD-10-CM | POA: Diagnosis not present

## 2019-02-28 DIAGNOSIS — E785 Hyperlipidemia, unspecified: Secondary | ICD-10-CM | POA: Diagnosis not present

## 2019-02-28 DIAGNOSIS — I252 Old myocardial infarction: Secondary | ICD-10-CM | POA: Diagnosis not present

## 2019-02-28 DIAGNOSIS — Z0000 Encounter for general adult medical examination without abnormal findings: Secondary | ICD-10-CM

## 2019-02-28 DIAGNOSIS — Z114 Encounter for screening for human immunodeficiency virus [HIV]: Secondary | ICD-10-CM

## 2019-02-28 DIAGNOSIS — Z23 Encounter for immunization: Secondary | ICD-10-CM

## 2019-02-28 DIAGNOSIS — F4323 Adjustment disorder with mixed anxiety and depressed mood: Secondary | ICD-10-CM

## 2019-02-28 DIAGNOSIS — E119 Type 2 diabetes mellitus without complications: Secondary | ICD-10-CM | POA: Diagnosis not present

## 2019-02-28 DIAGNOSIS — Z1231 Encounter for screening mammogram for malignant neoplasm of breast: Secondary | ICD-10-CM

## 2019-02-28 DIAGNOSIS — Z1329 Encounter for screening for other suspected endocrine disorder: Secondary | ICD-10-CM

## 2019-02-28 DIAGNOSIS — Z130 Encounter for screening for diseases of the blood and blood-forming organs and certain disorders involving the immune mechanism: Secondary | ICD-10-CM

## 2019-02-28 MED ORDER — ALPRAZOLAM 0.25 MG PO TABS: 0.2500 mg | ORAL_TABLET | Freq: Every day | ORAL | 1 refills | Status: DC

## 2019-02-28 MED ORDER — HYDROCHLOROTHIAZIDE 25 MG PO TABS: 25.0000 mg | ORAL_TABLET | Freq: Every day | ORAL | 3 refills | Status: DC

## 2019-02-28 MED ORDER — CLOPIDOGREL BISULFATE 75 MG PO TABS: 75.0000 mg | ORAL_TABLET | Freq: Every day | ORAL | 3 refills | Status: DC

## 2019-02-28 MED ORDER — NITROGLYCERIN 0.4 MG SL SUBL: 0.4000 mg | SUBLINGUAL_TABLET | SUBLINGUAL | 1 refills | Status: DC | PRN

## 2019-02-28 MED ORDER — TELMISARTAN 40 MG PO TABS: 40.0000 mg | ORAL_TABLET | Freq: Every day | ORAL | 3 refills | Status: DC

## 2019-02-28 MED ORDER — ROSUVASTATIN CALCIUM 40 MG PO TABS: ORAL_TABLET | ORAL | 3 refills | Status: DC

## 2019-03-01 ENCOUNTER — Other Ambulatory Visit: Payer: BC Managed Care – PPO

## 2019-03-01 ENCOUNTER — Encounter: Payer: Self-pay | Admitting: Family Medicine

## 2019-03-01 LAB — COMPREHENSIVE METABOLIC PANEL
ALT: 24 U/L (ref 0–35)
AST: 21 U/L (ref 0–37)
Albumin: 4.4 g/dL (ref 3.5–5.2)
Alkaline Phosphatase: 53 U/L (ref 39–117)
BUN: 13 mg/dL (ref 6–23)
CO2: 26 mEq/L (ref 19–32)
Calcium: 9.2 mg/dL (ref 8.4–10.5)
Chloride: 108 mEq/L (ref 96–112)
Creatinine, Ser: 0.68 mg/dL (ref 0.40–1.20)
GFR: 105.16 mL/min (ref >60.00)
Glucose, Bld: 95 mg/dL (ref 70–99)
Potassium: 3.9 mEq/L (ref 3.5–5.1)
Sodium: 142 mEq/L (ref 135–145)
Total Bilirubin: 0.4 mg/dL (ref 0.2–1.2)
Total Protein: 6.7 g/dL (ref 6.0–8.3)

## 2019-03-01 LAB — LIPID PANEL
Cholesterol: 126 mg/dL (ref 0–200)
HDL: 37.2 mg/dL — ABNORMAL LOW (ref >39.00)
LDL Cholesterol: 72 mg/dL (ref 0–99)
NonHDL: 88.42
Total CHOL/HDL Ratio: 3
Triglycerides: 82 mg/dL (ref 0.0–149.0)
VLDL: 16.4 mg/dL (ref 0.0–40.0)

## 2019-03-01 LAB — CBC
HCT: 36.3 % (ref 36.0–46.0)
Hemoglobin: 12.2 g/dL (ref 12.0–15.0)
MCHC: 33.7 g/dL (ref 30.0–36.0)
MCV: 88.8 fl (ref 78.0–100.0)
Platelets: 232 10*3/uL (ref 150.0–400.0)
RBC: 4.09 Mil/uL (ref 3.87–5.11)
RDW: 14.1 % (ref 11.5–15.5)
WBC: 6.9 10*3/uL (ref 4.0–10.5)

## 2019-03-01 LAB — TSH: TSH: 2.58 u[IU]/mL (ref 0.35–4.50)

## 2019-03-01 LAB — HEMOGLOBIN A1C: Hgb A1c MFr Bld: 6.9 % — ABNORMAL HIGH (ref 4.6–6.5)

## 2019-03-01 LAB — HIV ANTIBODY (ROUTINE TESTING W REFLEX): HIV 1&2 Ab, 4th Generation: NONREACTIVE

## 2019-03-02 LAB — CYTOLOGY - PAP
Comment: NEGATIVE
Diagnosis: NEGATIVE
High risk HPV: NEGATIVE

## 2019-03-02 LAB — MICROALBUMIN / CREATININE URINE RATIO
Creatinine,U: 49.7 mg/dL
Microalb Creat Ratio: 1.4 mg/g (ref 0.0–30.0)
Microalb, Ur: <0.7 mg/dL (ref 0.0–1.9)

## 2019-03-03 ENCOUNTER — Encounter: Payer: Self-pay | Admitting: Family Medicine

## 2019-03-15 DIAGNOSIS — I2510 Atherosclerotic heart disease of native coronary artery without angina pectoris: Secondary | ICD-10-CM | POA: Diagnosis not present

## 2019-03-15 DIAGNOSIS — E7800 Pure hypercholesterolemia, unspecified: Secondary | ICD-10-CM | POA: Diagnosis not present

## 2019-03-15 DIAGNOSIS — I10 Essential (primary) hypertension: Secondary | ICD-10-CM | POA: Diagnosis not present

## 2019-03-15 DIAGNOSIS — I252 Old myocardial infarction: Secondary | ICD-10-CM | POA: Diagnosis not present

## 2019-07-03 ENCOUNTER — Other Ambulatory Visit: Payer: Self-pay | Admitting: Family Medicine

## 2019-07-03 DIAGNOSIS — E785 Hyperlipidemia, unspecified: Secondary | ICD-10-CM

## 2019-10-19 ENCOUNTER — Ambulatory Visit: Payer: Self-pay

## 2019-10-19 ENCOUNTER — Other Ambulatory Visit: Payer: Self-pay

## 2019-10-19 ENCOUNTER — Encounter: Payer: Self-pay | Admitting: Family Medicine

## 2019-10-19 ENCOUNTER — Ambulatory Visit: Payer: BC Managed Care – PPO | Admitting: Family Medicine

## 2019-10-19 VITALS — BP 133/62 | HR 65 | Ht 61.0 in | Wt 160.0 lb

## 2019-10-19 DIAGNOSIS — G5601 Carpal tunnel syndrome, right upper limb: Secondary | ICD-10-CM

## 2019-10-19 HISTORY — DX: Carpal tunnel syndrome, right upper limb: G56.01

## 2019-10-19 MED ORDER — PENNSAID 2 % EX SOLN: 1.0000 "application " | Freq: Two times a day (BID) | CUTANEOUS | 3 refills | Status: DC

## 2019-10-19 NOTE — Progress Notes (Signed)
Tiffany Powers - 64 y.o. female MRN [id number]  Date of birth: [birth year]  SUBJECTIVE:  Including CC & ROS.  Chief Complaint  Patient presents with  . Hand Pain    right    Tiffany Powers is a 64 y.o. female that is presenting with pain and numbness of the right hand palmar aspect of the first 3 digits.  Has been ongoing for over a month.  Denies history of surgery or similar complaints.  Has trouble opening doors and jars.  Symptoms seem to be worse at night but also experiences through the course of the day.   Review of Systems See HPI   HISTORY: Past Medical, Surgical, Social, and Family History Reviewed & Updated per EMR.   Pertinent Historical Findings include:  Past Medical History:  Diagnosis Date  . High cholesterol   . Hypertension   . MI (myocardial infarction) ([place]) 2001  . NSTEMI (non-ST elevated myocardial infarction) ([place]) 12/07/2015    Past Surgical History:  Procedure Laterality Date  . CARDIAC CATHETERIZATION  2001; 2002; 12/06/2105  . CARDIAC CATHETERIZATION N/A 12/07/2015   Procedure: Left Heart Cath and Coronary Angiography;  Surgeon: [name] Prows, MD;  Location: [facility];  Service: Cardiovascular;  Laterality: N/A;  . TUBAL LIGATION  1980s    Family History  Problem Relation Age of Onset  . CAD Father 69       MI  . CAD Brother 34       MI  . CAD Maternal Grandmother 65       CABG    Social History   Socioeconomic History  . Marital status: Divorced    Spouse name: Not on file  . Number of children: Not on file  . Years of education: Not on file  . Highest education level: Not on file  Occupational History  . Not on file  Tobacco Use  . Smoking status: Never Smoker  . Smokeless tobacco: Never Used  Substance and Sexual Activity  . Alcohol use: Not on file  . Drug use: No  . Sexual activity: Never  Other Topics Concern  . Not on file  Social History Narrative  . Not on file   Social Determinants of Health   Financial Resource  Strain:   . Difficulty of Paying Living Expenses:   Food Insecurity:   . Worried About Charity fundraiser in the Last Year:   . Arboriculturist in the Last Year:   Transportation Needs:   . Film/video editor (Medical):   Marland Kitchen Lack of Transportation (Non-Medical):   Physical Activity:   . Days of Exercise per Week:   . Minutes of Exercise per Session:   Stress:   . Feeling of Stress :   Social Connections:   . Frequency of Communication with Friends and Family:   . Frequency of Social Gatherings with Friends and Family:   . Attends Religious Services:   . Active Member of Clubs or Organizations:   . Attends Archivist Meetings:   Marland Kitchen Marital Status:   Intimate Partner Violence:   . Fear of Current or Ex-Partner:   . Emotionally Abused:   Marland Kitchen Physically Abused:   . Sexually Abused:      PHYSICAL EXAM:  VS: BP 133/62   Pulse 65   Ht 5\' 1"  (1.549 m)   Wt 160 lb (72.6 kg)   BMI 30.23 kg/m  Physical Exam Gen: NAD, alert, cooperative with exam, well-appearing MSK:  Right  hand: No signs of atrophy. Normal grip strength. Normal strength resistance with finger abduction and adduction. Positive Tinel's at the wrist. Neurovascular intact  Limited ultrasound: Right wrist:  Median nerve is showing swelling in the carpal tunnel with an increased circumference measurement of 0.13 cm.  Proximally shows a more normal architecture of the nerve.  Summary: Findings suggest carpal tunnel syndrome.  Ultrasound and interpretation by Clearance Coots, MD    ASSESSMENT & PLAN:   Carpal tunnel syndrome of right wrist Symptoms suggestive of carpal tunnel syndrome.  Has changes on ultrasound and clinical exam would suggest as well. -Counseled on home exercise therapy and supportive care. -Wrist brace. -Pennsaid. -Could consider injection or prednisone if no improvement.

## 2019-10-19 NOTE — Assessment & Plan Note (Signed)
Symptoms suggestive of carpal tunnel syndrome.  Has changes on ultrasound and clinical exam would suggest as well. -Counseled on home exercise therapy and supportive care. -Wrist brace. -Pennsaid. -Could consider injection or prednisone if no improvement.

## 2019-10-19 NOTE — Patient Instructions (Signed)
Nice to meet you Please try the brace at night or performing repetitive movements  Please try the exercises  I have sent in pennsaid which is the rub on medicine.  Please send me a message in MyChart with any questions or updates.  Please see me back in 4 weeks.   --Dr. Raeford Razor

## 2019-11-16 ENCOUNTER — Other Ambulatory Visit: Payer: Self-pay

## 2019-11-16 ENCOUNTER — Ambulatory Visit: Payer: BC Managed Care – PPO | Admitting: Family Medicine

## 2019-11-16 ENCOUNTER — Encounter: Payer: Self-pay | Admitting: Family Medicine

## 2019-11-16 DIAGNOSIS — G5601 Carpal tunnel syndrome, right upper limb: Secondary | ICD-10-CM

## 2019-11-16 NOTE — Assessment & Plan Note (Signed)
Symptoms are about the same.  She stays busy with yard work as well as take care of her mother. -Counseled on home exercise therapy and supportive care. -Counseled on trying to brace during the day. -Can continue the topical NSAID. -Could consider injection or medication.

## 2019-11-16 NOTE — Progress Notes (Signed)
Tiffany Powers - 65 y.o. female MRN [id number]  Date of birth: 11-19-1955  SUBJECTIVE:  Including CC & ROS.  Chief Complaint  Patient presents with  . Follow-up    right wrist    Tiffany Powers is a 63 y.o. female that is following up for her carpal tunnel syndrome.  Seems to be about the same.  No improvement with bracing and topical NSAID at this point.   Review of Systems See HPI   HISTORY: Past Medical, Surgical, Social, and Family History Reviewed & Updated per EMR.   Pertinent Historical Findings include:  Past Medical History:  Diagnosis Date  . High cholesterol   . Hypertension   . MI (myocardial infarction) ([place]) 2001  . NSTEMI (non-ST elevated myocardial infarction) ([place]) 12/07/2015    Past Surgical History:  Procedure Laterality Date  . CARDIAC CATHETERIZATION  2001; 2002; 12/06/2105  . CARDIAC CATHETERIZATION N/A 12/07/2015   Procedure: Left Heart Cath and Coronary Angiography;  Surgeon: [name] Prows, MD;  Location: [facility];  Service: Cardiovascular;  Laterality: N/A;  . TUBAL LIGATION  1980s    Family History  Problem Relation Age of Onset  . CAD Father 51       MI  . CAD Brother 28       MI  . CAD Maternal Grandmother 65       CABG    Social History   Socioeconomic History  . Marital status: Divorced    Spouse name: Not on file  . Number of children: Not on file  . Years of education: Not on file  . Highest education level: Not on file  Occupational History  . Not on file  Tobacco Use  . Smoking status: Never Smoker  . Smokeless tobacco: Never Used  Substance and Sexual Activity  . Alcohol use: Not on file  . Drug use: No  . Sexual activity: Never  Other Topics Concern  . Not on file  Social History Narrative  . Not on file   Social Determinants of Health   Financial Resource Strain:   . Difficulty of Paying Living Expenses:   Food Insecurity:   . Worried About Charity fundraiser in the Last Year:   . Arboriculturist in the  Last Year:   Transportation Needs:   . Film/video editor (Medical):   Marland Kitchen Lack of Transportation (Non-Medical):   Physical Activity:   . Days of Exercise per Week:   . Minutes of Exercise per Session:   Stress:   . Feeling of Stress :   Social Connections:   . Frequency of Communication with Friends and Family:   . Frequency of Social Gatherings with Friends and Family:   . Attends Religious Services:   . Active Member of Clubs or Organizations:   . Attends Archivist Meetings:   Marland Kitchen Marital Status:   Intimate Partner Violence:   . Fear of Current or Ex-Partner:   . Emotionally Abused:   Marland Kitchen Physically Abused:   . Sexually Abused:      PHYSICAL EXAM:  VS: BP 124/71   Pulse 64   Ht 5\' 1"  (1.549 m)   Wt 160 lb (72.6 kg)   BMI 30.23 kg/m  Physical Exam Gen: NAD, alert, cooperative with exam, well-appearing MSK:  Right hand: No signs of atrophy. Normal finger abduction and adduction  Normal pincer grasp. Neurovascularly intact     ASSESSMENT & PLAN:   Carpal tunnel syndrome of right wrist  Symptoms are about the same.  She stays busy with yard work as well as take care of her mother. -Counseled on home exercise therapy and supportive care. -Counseled on trying to brace during the day. -Can continue the topical NSAID. -Could consider injection or medication.

## 2020-01-04 ENCOUNTER — Ambulatory Visit: Payer: BC Managed Care – PPO | Admitting: Family Medicine

## 2020-05-12 NOTE — Progress Notes (Addendum)
Tiffany Powers at [facility] [address], [place], [place] [address] [phone]  Date:  05/16/2020   Name:  Tiffany Powers   DOB:  04-08-1956   MRN:  [id number]  PCP:  Darreld Mclean, MD    Chief Complaint: Annual Exam   History of Present Illness:  Tiffany Powers is a 65 y.o. 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- another sister is also there- they are all living together Her mother suffered a stroke last year but is still able to get to chair, etc Her sister is ill with schizophrenia- she also has memory loss   She is fasting this am  covid series- done  Flu vaccine done- done  Eye exam mammo- will order for her [facility] when her medicare kicks in July  dexa- will order for her as above in July Foot exam Can do prevnar today -we will defer until insurance starts Pap 2020:    High risk HPV Negative   Adequacy Satisfactory for evaluation; transformation zone component PRESENT.   Diagnosis - Negative for intraepithelial lesion or malignancy (NILM)   Comment Normal Reference Range HPV - Negative       Patient Active Problem List   Diagnosis Date Noted  . Carpal tunnel syndrome of right wrist 10/19/2019  . Controlled type 2 diabetes mellitus without complication, without long-term current use of insulin ([place]) 12/31/2017  . Hyperlipidemia 07/30/2016  . 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  . NSTEMI (non-ST elevated myocardial infarction) ([place]) 12/07/2015    Past Medical History:  Diagnosis Date  . High cholesterol   . Hypertension   . MI (myocardial infarction) ([place]) 2001  . NSTEMI (non-ST elevated myocardial infarction) ([place]) 12/07/2015    Past Surgical History:   Procedure Laterality Date  . CARDIAC CATHETERIZATION  2001; 2002; 12/06/2105  . CARDIAC CATHETERIZATION N/A 12/07/2015   Procedure: Left Heart Cath and Coronary Angiography;  Surgeon: [name] Prows, MD;  Location: [facility];  Service: Cardiovascular;  Laterality: N/A;  . TUBAL LIGATION  1980s    Social History   Tobacco Use  . Smoking status: Never Smoker  . Smokeless tobacco: Never Used  Substance Use Topics  . Drug use: No    Family History  Problem Relation Age of Onset  . CAD Father 28       MI  . CAD Brother 16       MI  . CAD Maternal Grandmother 65       CABG    No Known Allergies  Medication list has been reviewed and updated.  Current Outpatient Medications on File Prior to Visit  Medication Sig Dispense Refill  . Cyanocobalamin (VITAMIN B-12 PO) Take 1 tablet by mouth daily.    . nitroGLYCERIN (NITROSTAT) 0.4 MG SL tablet Place 1 tablet (0.4 mg total) under the tongue every 5 (five) minutes as needed for chest pain. 25 tablet 1  . tiZANidine (ZANAFLEX) 4 MG tablet Take 1/2 or 1 every 8 hours as needed for muscles spasm 30 tablet 1  . triamcinolone cream (KENALOG) 0.1 % Apply 1 application topically 2 (two) times daily. Use as needed for rash on neck 45 g 0   No current  facility-administered medications on file prior to visit.    Review of Systems:  As per HPI- otherwise negative.   Physical Examination: Vitals:   05/16/20 1005  BP: 126/80  Pulse: 63  Resp: 16  SpO2: 98%   Vitals:   05/16/20 1005  Weight: 156 lb (70.8 kg)  Height: 5\' 1"  (1.549 m)   Body mass index is 29.48 kg/m. Ideal Body Weight: Weight in (lb) to have BMI = 25: 132  GEN: no acute distress.  Overweight, otherwise looks well HEENT: Atraumatic, Normocephalic.  Ears and Nose: No external deformity. CV: RRR, No M/G/R. No JVD. No thrill. No extra heart sounds. PULM: CTA B, no wheezes, crackles, rhonchi. No retractions. No resp. distress. No accessory muscle use. ABD: S,  NT, ND, +BS. No rebound. No HSM. EXTR: No c/c/e PSYCH: Normally interactive. Conversant.  Foot exam normal today  Assessment and Plan: Controlled type 2 diabetes mellitus without complication, without long-term current use of insulin (HCC) - Plan: Comprehensive metabolic panel, Hemoglobin A1c  History of MI (myocardial infarction) - Plan: clopidogrel (PLAVIX) 75 MG tablet, hydrochlorothiazide (HYDRODIURIL) 25 MG tablet, telmisartan (MICARDIS) 40 MG tablet  Hyperlipidemia, unspecified hyperlipidemia type - Plan: Lipid panel, rosuvastatin (CRESTOR) 40 MG tablet  Encounter for screening mammogram for malignant neoplasm of breast  Screening for deficiency anemia - Plan: CBC  Screening for thyroid disorder - Plan: TSH  Adjustment disorder with mixed anxiety and depressed mood - Plan: ALPRAZolam (XANAX) 0.25 MG tablet  Patient today for follow-up visit.  I have refilled her medications, routine lab work pending as above She is currently self-pay, her Medicare will kick in the summer.  She will contact me at that time and I will finish ordering maintenance screening such as mammogram and bone density Will plan further follow- up pending labs.  This visit occurred during the SARS-CoV-2 public health emergency.  Safety protocols were in place, including screening questions prior to the visit, additional usage of staff PPE, and extensive cleaning of exam room while observing appropriate contact time as indicated for disinfecting solutions.    Signed Lamar Blinks, MD  Received her labs as below, message to pt  Results for orders placed or performed in visit on 05/16/20  CBC  Result Value Ref Range   WBC 6.4 4.0 - 10.5 K/uL   RBC 4.35 3.87 - 5.11 Mil/uL   Platelets 261.0 150.0 - 400.0 K/uL   Hemoglobin 13.1 12.0 - 15.0 g/dL   HCT 38.3 36.0 - 46.0 %   MCV 88.2 78.0 - 100.0 fl   MCHC 34.2 30.0 - 36.0 g/dL   RDW 13.7 11.5 - 15.5 %  Comprehensive metabolic panel  Result Value Ref Range    Sodium 138 135 - 145 mEq/L   Potassium 4.1 3.5 - 5.1 mEq/L   Chloride 103 96 - 112 mEq/L   CO2 29 19 - 32 mEq/L   Glucose, Bld 105 (H) 70 - 99 mg/dL   BUN 17 6 - 23 mg/dL   Creatinine, Ser 0.76 0.40 - 1.20 mg/dL   Total Bilirubin 0.6 0.2 - 1.2 mg/dL   Alkaline Phosphatase 74 39 - 117 U/L   AST 23 0 - 37 U/L   ALT 24 0 - 35 U/L   Total Protein 7.2 6.0 - 8.3 g/dL   Albumin 4.5 3.5 - 5.2 g/dL   GFR 81.93 >60.00 mL/min   Calcium 9.8 8.4 - 10.5 mg/dL  Hemoglobin A1c  Result Value Ref Range   Hgb A1c MFr Bld  6.6 (H) 4.6 - 6.5 %  Lipid panel  Result Value Ref Range   Cholesterol 142 0 - 200 mg/dL   Triglycerides 067.7 0.0 - 149.0 mg/dL   HDL 03.40 (L) >35.24 mg/dL   VLDL 81.8 0.0 - 59.0 mg/dL   LDL Cholesterol 86 0 - 99 mg/dL   Total CHOL/HDL Ratio 4    NonHDL 108.00   TSH  Result Value Ref Range   TSH 2.81 0.35 - 4.50 uIU/mL

## 2020-05-12 NOTE — Patient Instructions (Incomplete)
It was good to see you again today- I will be in touch with your labs asap Please send me a message once your medicare kicks in and I will rx your mammogram and bone density scan!    Health Maintenance After Age 66 After age 71, you are at a higher risk for certain long-term diseases and infections as well as injuries from falls. Falls are a major cause of broken bones and head injuries in people who are older than age 22. Getting regular preventive care can help to keep you healthy and well. Preventive care includes getting regular testing and making lifestyle changes as recommended by your health care provider. Talk with your health care provider about:  Which screenings and tests you should have. A screening is a test that checks for a disease when you have no symptoms.  A diet and exercise plan that is right for you. What should I know about screenings and tests to prevent falls? Screening and testing are the best ways to find a health problem early. Early diagnosis and treatment give you the best chance of managing medical conditions that are common after age 46. Certain conditions and lifestyle choices may make you more likely to have a fall. Your health care provider may recommend:  Regular vision checks. Poor vision and conditions such as cataracts can make you more likely to have a fall. If you wear glasses, make sure to get your prescription updated if your vision changes.  Medicine review. Work with your health care provider to regularly review all of the medicines you are taking, including over-the-counter medicines. Ask your health care provider about any side effects that may make you more likely to have a fall. Tell your health care provider if any medicines that you take make you feel dizzy or sleepy.  Osteoporosis screening. Osteoporosis is a condition that causes the bones to get weaker. This can make the bones weak and cause them to break more easily.  Blood pressure screening.  Blood pressure changes and medicines to control blood pressure can make you feel dizzy.  Strength and balance checks. Your health care provider may recommend certain tests to check your strength and balance while standing, walking, or changing positions.  Foot health exam. Foot pain and numbness, as well as not wearing proper footwear, can make you more likely to have a fall.  Depression screening. You may be more likely to have a fall if you have a fear of falling, feel emotionally low, or feel unable to do activities that you used to do.  Alcohol use screening. Using too much alcohol can affect your balance and may make you more likely to have a fall. What actions can I take to lower my risk of falls? General instructions  Talk with your health care provider about your risks for falling. Tell your health care provider if: ? You fall. Be sure to tell your health care provider about all falls, even ones that seem minor. ? You feel dizzy, sleepy, or off-balance.  Take over-the-counter and prescription medicines only as told by your health care provider. These include any supplements.  Eat a healthy diet and maintain a healthy weight. A healthy diet includes low-fat dairy products, low-fat (lean) meats, and fiber from whole grains, beans, and lots of fruits and vegetables. Home safety  Remove any tripping hazards, such as rugs, cords, and clutter.  Install safety equipment such as grab bars in bathrooms and safety rails on stairs.  Keep rooms and  walkways well-lit. Activity  Follow a regular exercise program to stay fit. This will help you maintain your balance. Ask your health care provider what types of exercise are appropriate for you.  If you need a cane or walker, use it as recommended by your health care provider.  Wear supportive shoes that have nonskid soles.   Lifestyle  Do not drink alcohol if your health care provider tells you not to drink.  If you drink alcohol, limit  how much you have: ? 0-1 drink a day for women. ? 0-2 drinks a day for men.  Be aware of how much alcohol is in your drink. In the [place] one drink equals one typical bottle of beer (12 oz), one-half glass of wine (5 oz), or one shot of hard liquor (1 oz).  Do not use any products that contain nicotine or tobacco, such as cigarettes and e-cigarettes. If you need help quitting, ask your health care provider. Summary  Having a healthy lifestyle and getting preventive care can help to protect your health and wellness after age 60.  Screening and testing are the best way to find a health problem early and help you avoid having a fall. Early diagnosis and treatment give you the best chance for managing medical conditions that are more common for people who are older than age 33.  Falls are a major cause of broken bones and head injuries in people who are older than age 29. Take precautions to prevent a fall at home.  Work with your health care provider to learn what changes you can make to improve your health and wellness and to prevent falls. This information is not intended to replace advice given to you by your health care provider. Make sure you discuss any questions you have with your health care provider. Document Revised: 07/22/2018 Document Reviewed: 02/11/2017 Elsevier Patient Education  2021 [employer].

## 2020-05-16 ENCOUNTER — Encounter: Payer: Self-pay | Admitting: Family Medicine

## 2020-05-16 ENCOUNTER — Other Ambulatory Visit: Payer: Self-pay

## 2020-05-16 ENCOUNTER — Ambulatory Visit (INDEPENDENT_AMBULATORY_CARE_PROVIDER_SITE_OTHER): Payer: Self-pay | Admitting: Family Medicine

## 2020-05-16 VITALS — BP 126/80 | HR 63 | Resp 16 | Ht 61.0 in | Wt 156.0 lb

## 2020-05-16 DIAGNOSIS — I252 Old myocardial infarction: Secondary | ICD-10-CM

## 2020-05-16 DIAGNOSIS — F4323 Adjustment disorder with mixed anxiety and depressed mood: Secondary | ICD-10-CM

## 2020-05-16 DIAGNOSIS — Z130 Encounter for screening for diseases of the blood and blood-forming organs and certain disorders involving the immune mechanism: Secondary | ICD-10-CM

## 2020-05-16 DIAGNOSIS — E119 Type 2 diabetes mellitus without complications: Secondary | ICD-10-CM

## 2020-05-16 DIAGNOSIS — Z1329 Encounter for screening for other suspected endocrine disorder: Secondary | ICD-10-CM

## 2020-05-16 DIAGNOSIS — E785 Hyperlipidemia, unspecified: Secondary | ICD-10-CM

## 2020-05-16 DIAGNOSIS — Z1231 Encounter for screening mammogram for malignant neoplasm of breast: Secondary | ICD-10-CM

## 2020-05-16 DIAGNOSIS — Z0000 Encounter for general adult medical examination without abnormal findings: Secondary | ICD-10-CM

## 2020-05-16 LAB — LIPID PANEL
Cholesterol: 142 mg/dL (ref 0–200)
HDL: 34.2 mg/dL — ABNORMAL LOW (ref >39.00)
LDL Cholesterol: 86 mg/dL (ref 0–99)
NonHDL: 108
Total CHOL/HDL Ratio: 4
Triglycerides: 109 mg/dL (ref 0.0–149.0)
VLDL: 21.8 mg/dL (ref 0.0–40.0)

## 2020-05-16 LAB — CBC
HCT: 38.3 % (ref 36.0–46.0)
Hemoglobin: 13.1 g/dL (ref 12.0–15.0)
MCHC: 34.2 g/dL (ref 30.0–36.0)
MCV: 88.2 fl (ref 78.0–100.0)
Platelets: 261 10*3/uL (ref 150.0–400.0)
RBC: 4.35 Mil/uL (ref 3.87–5.11)
RDW: 13.7 % (ref 11.5–15.5)
WBC: 6.4 10*3/uL (ref 4.0–10.5)

## 2020-05-16 LAB — COMPREHENSIVE METABOLIC PANEL
ALT: 24 U/L (ref 0–35)
AST: 23 U/L (ref 0–37)
Albumin: 4.5 g/dL (ref 3.5–5.2)
Alkaline Phosphatase: 74 U/L (ref 39–117)
BUN: 17 mg/dL (ref 6–23)
CO2: 29 mEq/L (ref 19–32)
Calcium: 9.8 mg/dL (ref 8.4–10.5)
Chloride: 103 mEq/L (ref 96–112)
Creatinine, Ser: 0.76 mg/dL (ref 0.40–1.20)
GFR: 81.93 mL/min (ref >60.00)
Glucose, Bld: 105 mg/dL — ABNORMAL HIGH (ref 70–99)
Potassium: 4.1 mEq/L (ref 3.5–5.1)
Sodium: 138 mEq/L (ref 135–145)
Total Bilirubin: 0.6 mg/dL (ref 0.2–1.2)
Total Protein: 7.2 g/dL (ref 6.0–8.3)

## 2020-05-16 LAB — TSH: TSH: 2.81 u[IU]/mL (ref 0.35–4.50)

## 2020-05-16 LAB — HEMOGLOBIN A1C: Hgb A1c MFr Bld: 6.6 % — ABNORMAL HIGH (ref 4.6–6.5)

## 2020-05-16 MED ORDER — TELMISARTAN 40 MG PO TABS: 40.0000 mg | ORAL_TABLET | Freq: Every day | ORAL | 3 refills | Status: DC

## 2020-05-16 MED ORDER — CLOPIDOGREL BISULFATE 75 MG PO TABS: 75.0000 mg | ORAL_TABLET | Freq: Every day | ORAL | 3 refills | Status: DC

## 2020-05-16 MED ORDER — ROSUVASTATIN CALCIUM 40 MG PO TABS
40.0000 mg | ORAL_TABLET | Freq: Every day | ORAL | 3 refills | Status: DC
Start: 2020-05-16 — End: 2021-06-06

## 2020-05-16 MED ORDER — HYDROCHLOROTHIAZIDE 25 MG PO TABS: 25.0000 mg | ORAL_TABLET | Freq: Every day | ORAL | 3 refills | Status: DC

## 2020-05-16 MED ORDER — ALPRAZOLAM 0.25 MG PO TABS: 0.2500 mg | ORAL_TABLET | Freq: Every day | ORAL | 1 refills | Status: DC

## 2020-11-23 ENCOUNTER — Encounter: Payer: Self-pay | Admitting: Family Medicine

## 2020-11-23 DIAGNOSIS — Z1231 Encounter for screening mammogram for malignant neoplasm of breast: Secondary | ICD-10-CM

## 2020-11-23 DIAGNOSIS — E2839 Other primary ovarian failure: Secondary | ICD-10-CM

## 2020-11-24 NOTE — Addendum Note (Signed)
Addended by: Lamar Blinks C on: 11/24/2020 11:28 AM   Modules accepted: Orders

## 2020-12-31 ENCOUNTER — Encounter (HOSPITAL_BASED_OUTPATIENT_CLINIC_OR_DEPARTMENT_OTHER): Payer: Self-pay

## 2020-12-31 ENCOUNTER — Other Ambulatory Visit: Payer: Self-pay

## 2020-12-31 ENCOUNTER — Encounter: Payer: Self-pay | Admitting: Family Medicine

## 2020-12-31 ENCOUNTER — Ambulatory Visit (HOSPITAL_BASED_OUTPATIENT_CLINIC_OR_DEPARTMENT_OTHER)
Admission: RE | Admit: 2020-12-31 | Discharge: 2020-12-31 | Disposition: A | Discharge status: Home / Self Care | Payer: Medicare Other | Source: Ambulatory Visit | Attending: Family Medicine | Admitting: Family Medicine

## 2020-12-31 DIAGNOSIS — Z1231 Encounter for screening mammogram for malignant neoplasm of breast: Secondary | ICD-10-CM | POA: Insufficient documentation

## 2020-12-31 DIAGNOSIS — E2839 Other primary ovarian failure: Secondary | ICD-10-CM | POA: Diagnosis not present

## 2021-01-25 ENCOUNTER — Ambulatory Visit (INDEPENDENT_AMBULATORY_CARE_PROVIDER_SITE_OTHER): Payer: Medicare Other

## 2021-01-25 ENCOUNTER — Other Ambulatory Visit: Payer: Self-pay

## 2021-01-25 DIAGNOSIS — Z23 Encounter for immunization: Secondary | ICD-10-CM | POA: Diagnosis not present

## 2021-04-14 HISTORY — PX: COLONOSCOPY: SHX174

## 2021-06-03 NOTE — Patient Instructions (Addendum)
It was good to see you again today Please consider getting the shingles vaccine/Shingrix done at your pharmacy  Pneumonia booster given today Referral to [facility] for your colon- Dr Mann/ dr Benson [place] Address: [address], [place], [place] [address] Phone: [phone]  Referral to [facility] to discuss your bladder prolapse  Take care!  I will be in touch with your labs asap

## 2021-06-03 NOTE — Progress Notes (Signed)
[facility] [address], [place], [place] [address] [phone]  Date:  06/06/2021   Name:  Tiffany Powers   DOB:  1955-06-16   MRN:  [id number]  PCP:  Darreld Mclean, MD    Chief Complaint: Annual Exam (Concerns/ questions: none/Eye exam: scheduled for 06/28/21/Foot exam due/PNA: one dose/Zoster: none in ncir, would like to update)   History of Present Illness:  Tiffany Powers is a 66 y.o. very pleasant female patient who presents with the following:  Patient seen today for physical exam.  Last seen by myself about 1 year ago- patient with history of diabetes, hyperlipidemia, NSTEMI 2017  At her last visit she was caring for her mother and sister.  Her mother has suffered a stroke, her sister suffers from schizophrenia She notes that her sister with mental illness has gotten worse but her other sister is there and is helpful-overall she is managing  She is exercising by walking for about an hour 5-6 days a week She is really consistent about this even during the winter   Eye exam- appt 3/17 Foot exam Shingrix- encouraged her to do this  Due for pneumonia booster-Prevnar 20- will do today  Flu vaccine is up-to-date Colon cancer screening - she is due this year, but out of town Mammogram is up-to-date Most recent labs 1 year ago Pap 02/2019, normal.  Can update today if she would like - she notes an abnormal about 40 years ago Her most recent Pap was done at approximate 66.  She would prefer to discontinue Paps at this time   Crestor HCTZ Telmisartan Plavix  Most recent visit with cardiology was December 2020-cost, lack of insurance has been an issue.  Patient might wish to follow-up now-however, she would like to change her care to [facility] health.  We will place referral Patient Active Problem List   Diagnosis Date Noted   Carpal tunnel syndrome of right wrist 10/19/2019   Controlled type 2 diabetes mellitus  without complication, without long-term current use of insulin ([place]) 12/31/2017   Hyperlipidemia 07/30/2016   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   NSTEMI (non-ST elevated myocardial infarction) ([place]) 12/07/2015    Past Medical History:  Diagnosis Date   High cholesterol    Hypertension    MI (myocardial infarction) ([place]) 2001   NSTEMI (non-ST elevated myocardial infarction) ([place]) 12/07/2015    Past Surgical History:  Procedure Laterality Date   CARDIAC CATHETERIZATION  2001; 2002; 12/06/2105   CARDIAC CATHETERIZATION N/A 12/07/2015   Procedure: Left Heart Cath and Coronary Angiography;  Surgeon: [name] Prows, MD;  Location: [facility];  Service: Cardiovascular;  Laterality: N/A;   TUBAL LIGATION  1980s    Social History   Tobacco Use   Smoking status: Never   Smokeless tobacco: Never  Substance Use Topics   Drug use: No    Family History  Problem Relation Age of Onset   CAD Father 62       MI   CAD Brother 65       MI   CAD Maternal Grandmother 65       CABG    No Known Allergies  Medication list has been reviewed and updated.  Current Outpatient Medications on File Prior to Visit  Medication Sig Dispense Refill   ALPRAZolam (XANAX) 0.25 MG tablet Take 1 tablet (0.25 mg total) by mouth at bedtime. Use as needed for sleep 30 tablet 1  clopidogrel (PLAVIX) 75 MG tablet Take 1 tablet (75 mg total) by mouth daily. 90 tablet 3   Cyanocobalamin (VITAMIN B-12 PO) Take 1 tablet by mouth daily.     hydrochlorothiazide (HYDRODIURIL) 25 MG tablet Take 1 tablet (25 mg total) by mouth daily. 90 tablet 3   nitroGLYCERIN (NITROSTAT) 0.4 MG SL tablet Place 1 tablet (0.4 mg total) under the tongue every 5 (five) minutes as needed for chest pain. 25 tablet 1   rosuvastatin (CRESTOR) 40 MG tablet Take 1 tablet (40 mg total) by mouth daily. 90 tablet 3   telmisartan (MICARDIS) 40 MG tablet Take 1 tablet (40 mg total) by mouth daily. 90 tablet 3   tiZANidine (ZANAFLEX) 4 MG tablet  Take 1/2 or 1 every 8 hours as needed for muscles spasm 30 tablet 1   triamcinolone cream (KENALOG) 0.1 % Apply 1 application topically 2 (two) times daily. Use as needed for rash on neck 45 g 0   No current facility-administered medications on file prior to visit.    Review of Systems:  As per HPI- otherwise negative.   Physical Examination: Vitals:   06/06/21 1024  BP: 128/60  Pulse: 67  Resp: 18  Temp: 98.1 F (36.7 C)  SpO2: 98%   Vitals:   06/06/21 1024  Weight: 148 lb (67.1 kg)  Height: 5\' 1"  (1.549 m)   Body mass index is 27.96 kg/m. Ideal Body Weight: Weight in (lb) to have BMI = 25: 132  GEN: no acute distress.  Mild overweight, looks well HEENT: Atraumatic, Normocephalic. Bilateral TM wnl, oropharynx normal.  PEERL,EOMI.   Ears and Nose: No external deformity. CV: RRR, No M/G/R. No JVD. No thrill. No extra heart sounds. PULM: CTA B, no wheezes, crackles, rhonchi. No retractions. No resp. distress. No accessory muscle use. ABD: S, NT, ND, +BS. No rebound. No HSM. EXTR: No c/c/e PSYCH: Normally interactive. Conversant.  Foot exam today  Wt Readings from Last 3 Encounters:  06/06/21 148 lb (67.1 kg)  05/16/20 156 lb (70.8 kg)  11/16/19 160 lb (72.6 kg)     Assessment and Plan: Controlled type 2 diabetes mellitus without complication, without long-term current use of insulin (HCC) - Plan: Comprehensive metabolic panel, Hemoglobin A1c  History of MI (myocardial infarction) - Plan: telmisartan (MICARDIS) 40 MG tablet, nitroGLYCERIN (NITROSTAT) 0.4 MG SL tablet, hydrochlorothiazide (HYDRODIURIL) 25 MG tablet, clopidogrel (PLAVIX) 75 MG tablet, Ambulatory referral to Cardiology  Hyperlipidemia, unspecified hyperlipidemia type - Plan: Lipid panel, rosuvastatin (CRESTOR) 40 MG tablet  Screening for deficiency anemia - Plan: CBC  Screening for thyroid disorder - Plan: TSH  Adjustment disorder with mixed anxiety and depressed mood - Plan: ALPRAZolam (XANAX)  0.25 MG tablet  Fatigue, unspecified type - Plan: VITAMIN D 25 Hydroxy (Vit-D Deficiency, Fractures)  Screening for colon cancer - Plan: Ambulatory referral to Gastroenterology  Immunization due - Plan: Pneumococcal conjugate vaccine 20-valent (Prevnar 20)  Bladder prolapse, congenital - Plan: Ambulatory referral to Obstetrics / Gynecology  Patient seen today for follow-up Await labs to monitor her diabetes. Referral placed to cardiology to establish care with [facility] Patient has history of mild bladder prolapse.  It is bothering her more when she is walking for exercise, she notes a sense of fullness and discomfort.  Referral to GYN for evaluation and treatment options Due for colon cancer screening, referral to GI Update Prevnar Encouraged healthy diet and exercise routine- Will plan further follow- up pending labs.  Signed Lamar Blinks, MD Labs received, message to  patient Results for orders placed or performed in visit on 06/06/21  CBC  Result Value Ref Range   WBC 6.1 4.0 - 10.5 K/uL   RBC 4.19 3.87 - 5.11 Mil/uL   Platelets 244.0 150.0 - 400.0 K/uL   Hemoglobin 12.3 12.0 - 15.0 g/dL   HCT 37.0 36.0 - 46.0 %   MCV 88.2 78.0 - 100.0 fl   MCHC 33.2 30.0 - 36.0 g/dL   RDW 14.0 11.5 - 15.5 %  Comprehensive metabolic panel  Result Value Ref Range   Sodium 141 135 - 145 mEq/L   Potassium 4.2 3.5 - 5.1 mEq/L   Chloride 105 96 - 112 mEq/L   CO2 31 19 - 32 mEq/L   Glucose, Bld 99 70 - 99 mg/dL   BUN 18 6 - 23 mg/dL   Creatinine, Ser 0.77 0.40 - 1.20 mg/dL   Total Bilirubin 0.5 0.2 - 1.2 mg/dL   Alkaline Phosphatase 61 39 - 117 U/L   AST 21 0 - 37 U/L   ALT 19 0 - 35 U/L   Total Protein 7.0 6.0 - 8.3 g/dL   Albumin 4.5 3.5 - 5.2 g/dL   GFR 80.05 >60.00 mL/min   Calcium 9.8 8.4 - 10.5 mg/dL  Hemoglobin A1c  Result Value Ref Range   Hgb A1c MFr Bld 6.5 4.6 - 6.5 %  Lipid panel  Result Value Ref Range   Cholesterol 163 0 - 200 mg/dL   Triglycerides 81.0 0.0 - 149.0  mg/dL   HDL 45.20 >39.00 mg/dL   VLDL 16.2 0.0 - 40.0 mg/dL   LDL Cholesterol 101 (H) 0 - 99 mg/dL   Total CHOL/HDL Ratio 4    NonHDL 117.33   TSH  Result Value Ref Range   TSH 2.36 0.35 - 5.50 uIU/mL  VITAMIN D 25 Hydroxy (Vit-D Deficiency, Fractures)  Result Value Ref Range   VITD 53.22 30.00 - 100.00 ng/mL

## 2021-06-06 ENCOUNTER — Encounter: Payer: Self-pay | Admitting: Family Medicine

## 2021-06-06 ENCOUNTER — Ambulatory Visit (INDEPENDENT_AMBULATORY_CARE_PROVIDER_SITE_OTHER): Payer: Medicare Other | Admitting: Family Medicine

## 2021-06-06 VITALS — BP 128/60 | HR 67 | Temp 98.1°F | Resp 18 | Ht 61.0 in | Wt 148.0 lb

## 2021-06-06 DIAGNOSIS — I252 Old myocardial infarction: Secondary | ICD-10-CM

## 2021-06-06 DIAGNOSIS — F4323 Adjustment disorder with mixed anxiety and depressed mood: Secondary | ICD-10-CM

## 2021-06-06 DIAGNOSIS — E119 Type 2 diabetes mellitus without complications: Secondary | ICD-10-CM

## 2021-06-06 DIAGNOSIS — E785 Hyperlipidemia, unspecified: Secondary | ICD-10-CM | POA: Diagnosis not present

## 2021-06-06 DIAGNOSIS — R5383 Other fatigue: Secondary | ICD-10-CM

## 2021-06-06 DIAGNOSIS — Z1329 Encounter for screening for other suspected endocrine disorder: Secondary | ICD-10-CM | POA: Diagnosis not present

## 2021-06-06 DIAGNOSIS — Z130 Encounter for screening for diseases of the blood and blood-forming organs and certain disorders involving the immune mechanism: Secondary | ICD-10-CM | POA: Diagnosis not present

## 2021-06-06 DIAGNOSIS — Z23 Encounter for immunization: Secondary | ICD-10-CM

## 2021-06-06 DIAGNOSIS — Q794 Prune belly syndrome: Secondary | ICD-10-CM | POA: Diagnosis not present

## 2021-06-06 DIAGNOSIS — Z1211 Encounter for screening for malignant neoplasm of colon: Secondary | ICD-10-CM | POA: Diagnosis not present

## 2021-06-06 LAB — LIPID PANEL
Cholesterol: 163 mg/dL (ref 0–200)
HDL: 45.2 mg/dL (ref >39.00)
LDL Cholesterol: 101 mg/dL — ABNORMAL HIGH (ref 0–99)
NonHDL: 117.33
Total CHOL/HDL Ratio: 4
Triglycerides: 81 mg/dL (ref 0.0–149.0)
VLDL: 16.2 mg/dL (ref 0.0–40.0)

## 2021-06-06 LAB — COMPREHENSIVE METABOLIC PANEL
ALT: 19 U/L (ref 0–35)
AST: 21 U/L (ref 0–37)
Albumin: 4.5 g/dL (ref 3.5–5.2)
Alkaline Phosphatase: 61 U/L (ref 39–117)
BUN: 18 mg/dL (ref 6–23)
CO2: 31 mEq/L (ref 19–32)
Calcium: 9.8 mg/dL (ref 8.4–10.5)
Chloride: 105 mEq/L (ref 96–112)
Creatinine, Ser: 0.77 mg/dL (ref 0.40–1.20)
GFR: 80.05 mL/min (ref >60.00)
Glucose, Bld: 99 mg/dL (ref 70–99)
Potassium: 4.2 mEq/L (ref 3.5–5.1)
Sodium: 141 mEq/L (ref 135–145)
Total Bilirubin: 0.5 mg/dL (ref 0.2–1.2)
Total Protein: 7 g/dL (ref 6.0–8.3)

## 2021-06-06 LAB — CBC
HCT: 37 % (ref 36.0–46.0)
Hemoglobin: 12.3 g/dL (ref 12.0–15.0)
MCHC: 33.2 g/dL (ref 30.0–36.0)
MCV: 88.2 fl (ref 78.0–100.0)
Platelets: 244 10*3/uL (ref 150.0–400.0)
RBC: 4.19 Mil/uL (ref 3.87–5.11)
RDW: 14 % (ref 11.5–15.5)
WBC: 6.1 10*3/uL (ref 4.0–10.5)

## 2021-06-06 LAB — HEMOGLOBIN A1C: Hgb A1c MFr Bld: 6.5 % (ref 4.6–6.5)

## 2021-06-06 LAB — TSH: TSH: 2.36 u[IU]/mL (ref 0.35–5.50)

## 2021-06-06 LAB — VITAMIN D 25 HYDROXY (VIT D DEFICIENCY, FRACTURES): VITD: 53.22 ng/mL (ref 30.00–100.00)

## 2021-06-06 MED ORDER — NITROGLYCERIN 0.4 MG SL SUBL: 0.4000 mg | SUBLINGUAL_TABLET | SUBLINGUAL | 1 refills | Status: DC | PRN

## 2021-06-06 MED ORDER — HYDROCHLOROTHIAZIDE 25 MG PO TABS: 25.0000 mg | ORAL_TABLET | Freq: Every day | ORAL | 3 refills | Status: DC

## 2021-06-06 MED ORDER — ROSUVASTATIN CALCIUM 40 MG PO TABS: 40.0000 mg | ORAL_TABLET | Freq: Every day | ORAL | 3 refills | Status: DC

## 2021-06-06 MED ORDER — CLOPIDOGREL BISULFATE 75 MG PO TABS: 75.0000 mg | ORAL_TABLET | Freq: Every day | ORAL | 3 refills | Status: DC

## 2021-06-06 MED ORDER — ALPRAZOLAM 0.25 MG PO TABS: 0.2500 mg | ORAL_TABLET | Freq: Every day | ORAL | 1 refills | Status: DC

## 2021-06-06 MED ORDER — TELMISARTAN 40 MG PO TABS: 40.0000 mg | ORAL_TABLET | Freq: Every day | ORAL | 3 refills | Status: DC

## 2021-06-25 ENCOUNTER — Telehealth: Payer: Self-pay

## 2021-06-25 NOTE — Telephone Encounter (Signed)
Pt called today stating she has not heard anything from Dr. Jossie Ng office at [facility] office in regards to scheduling her colonoscopy.  She called their office today and they advised her they do not have anything to go off of to schedule her for the procedure.  Please advise.  Pt can be reached at [phone].  I advised pt I did see where referral was placed and authorized. ?

## 2021-06-25 NOTE — Telephone Encounter (Signed)
Lattie Haw with [facility] called and stated Dr. Collene Mares would not be able to see the patient for her colonoscopy, no reason as to why was explained and she stated provider did not provide one.  I asked her if Dr. Benson Norway would.  She stated he is not in the office today, but she could check and would get back to [place] in a few days once she asks him.   ?

## 2021-06-28 DIAGNOSIS — H25813 Combined forms of age-related cataract, bilateral: Secondary | ICD-10-CM | POA: Diagnosis not present

## 2021-06-28 DIAGNOSIS — H0288A Meibomian gland dysfunction right eye, upper and lower eyelids: Secondary | ICD-10-CM | POA: Diagnosis not present

## 2021-06-28 DIAGNOSIS — H0288B Meibomian gland dysfunction left eye, upper and lower eyelids: Secondary | ICD-10-CM | POA: Diagnosis not present

## 2021-06-28 DIAGNOSIS — H5213 Myopia, bilateral: Secondary | ICD-10-CM | POA: Diagnosis not present

## 2021-06-28 DIAGNOSIS — H04123 Dry eye syndrome of bilateral lacrimal glands: Secondary | ICD-10-CM | POA: Diagnosis not present

## 2021-06-28 DIAGNOSIS — H524 Presbyopia: Secondary | ICD-10-CM | POA: Diagnosis not present

## 2021-06-28 DIAGNOSIS — H52223 Regular astigmatism, bilateral: Secondary | ICD-10-CM | POA: Diagnosis not present

## 2021-07-01 ENCOUNTER — Other Ambulatory Visit: Payer: Self-pay

## 2021-07-01 ENCOUNTER — Ambulatory Visit: Payer: Medicare Other | Admitting: Cardiology

## 2021-07-01 ENCOUNTER — Encounter: Payer: Self-pay | Admitting: Cardiology

## 2021-07-01 VITALS — BP 134/70 | HR 65 | Ht 61.0 in | Wt 150.0 lb

## 2021-07-01 DIAGNOSIS — E782 Mixed hyperlipidemia: Secondary | ICD-10-CM | POA: Diagnosis not present

## 2021-07-01 DIAGNOSIS — I2510 Atherosclerotic heart disease of native coronary artery without angina pectoris: Secondary | ICD-10-CM

## 2021-07-01 DIAGNOSIS — I10 Essential (primary) hypertension: Secondary | ICD-10-CM | POA: Diagnosis not present

## 2021-07-01 HISTORY — DX: Atherosclerotic heart disease of native coronary artery without angina pectoris: I25.10

## 2021-07-01 MED ORDER — EZETIMIBE 10 MG PO TABS: 10.0000 mg | ORAL_TABLET | Freq: Every day | ORAL | 3 refills | Status: DC

## 2021-07-01 NOTE — Progress Notes (Signed)
? ?Cardiology Consultation:   ? ?Date:  07/01/2021  ? ?ID:  Tiffany Powers, DOB 10-21-1955, MRN [id number] ? ?PCP:  Darreld Mclean, MD  ?Cardiologist:  Jenne Campus, MD  ? ?Referring MD: Darreld Mclean, MD  ? ?No chief complaint on file. ? ? ?History of Present Illness:   ? ?Tiffany Powers is a 66 y.o. female who is being seen today for the evaluation of to be established as a patient at the request of Copland, Gay Filler, MD. with past medical history significant for small myocardial infarction 2017.  At that time she had the pain and up going to the emergency room.  She went for cardiac catheterization, cardiac catheterization revealed 99% stenosis very small intermediate branch as well as up to 30% stenosis of proximal LAD.  No intervention has been performed she is being discharged home.  She was told to very minimal damage to her heart.  She also had history of hypertension, dyslipidemia, she never smoked.  She does have multiple family members with premature coronary artery disease.  Apparently her brother had myocardial infarction in his 54s.  Also his father.  Only father was a smoker.  Clearly there is some family clustering of premature coronary artery disease.  She is doing very well right now.  She denies have any chest pain tightness squeezing pressure mid chest.  She is very busy taking care of her demented mother and her mother has been demented for 10 years.  Also his sister who is demented for about 7 years there is also question about potentially having schizophrenia.  So she is very busy taking care of them and she is doing excellent job.  She is also take care of herself.  She walks every single day she is trying to achieve 10,000 steps every single day.  Denies have any chest pain tightness pressure burning in the chest. ?She is not on any diet but we did have conversation about Mediterranean diet. ? ?Past Medical History:  ?Diagnosis Date  ? High cholesterol   ? Hypertension   ? MI  (myocardial infarction) ([place]) 2001  ? NSTEMI (non-ST elevated myocardial infarction) ([place]) 12/07/2015  ? ? ?Past Surgical History:  ?Procedure Laterality Date  ? CARDIAC CATHETERIZATION  2001; 2002; 12/06/2105  ? CARDIAC CATHETERIZATION N/A 12/07/2015  ? Procedure: Left Heart Cath and Coronary Angiography;  Surgeon: [name] Prows, MD;  Location: [facility];  Service: Cardiovascular;  Laterality: N/A;  ? TUBAL LIGATION  1980s  ? ? ?Current Medications: ?Current Meds  ?Medication Sig  ? ALPRAZolam (XANAX) 0.25 MG tablet Take 1 tablet (0.25 mg total) by mouth at bedtime. Use as needed for sleep  ? clopidogrel (PLAVIX) 75 MG tablet Take 1 tablet (75 mg total) by mouth daily.  ? Cyanocobalamin (VITAMIN B-12 PO) Take 1 tablet by mouth daily.  ? hydrochlorothiazide (HYDRODIURIL) 25 MG tablet Take 1 tablet (25 mg total) by mouth daily.  ? nitroGLYCERIN (NITROSTAT) 0.4 MG SL tablet Place 1 tablet (0.4 mg total) under the tongue every 5 (five) minutes as needed for chest pain.  ? rosuvastatin (CRESTOR) 40 MG tablet Take 1 tablet (40 mg total) by mouth daily.  ? telmisartan (MICARDIS) 40 MG tablet Take 1 tablet (40 mg total) by mouth daily.  ? tiZANidine (ZANAFLEX) 4 MG tablet Take 1/2 or 1 every 8 hours as needed for muscles spasm  ? triamcinolone cream (KENALOG) 0.1 % Apply 1 application topically 2 (two) times daily. Use as needed for rash  on neck  ?  ? ?Allergies:   Patient has no known allergies.  ? ?Social History  ? ?Socioeconomic History  ? Marital status: Divorced  ?  Spouse name: Not on file  ? Number of children: Not on file  ? Years of education: Not on file  ? Highest education level: Not on file  ?Occupational History  ? Not on file  ?Tobacco Use  ? Smoking status: Never  ?  Passive exposure: Never  ? Smokeless tobacco: Never  ?Vaping Use  ? Vaping Use: Never used  ?Substance and Sexual Activity  ? Alcohol use: Not on file  ? Drug use: No  ? Sexual activity: Never  ?Other Topics Concern  ? Not on file  ?Social  History Narrative  ? Not on file  ? ?Social Determinants of Health  ? ?Financial Resource Strain: Not on file  ?Food Insecurity: Not on file  ?Transportation Needs: Not on file  ?Physical Activity: Not on file  ?Stress: Not on file  ?Social Connections: Not on file  ?  ? ?Family History: ?The patient's family history includes CAD (age of onset: 65) in her brother; CAD (age of onset: 92) in her father; CAD (age of onset: 20) in her maternal grandmother. ?ROS:   ?Please see the history of present illness.    ?All 14 point review of systems negative except as described per history of present illness. ? ?EKGs/Labs/Other Studies Reviewed:   ? ?The following studies were reviewed today: ?I did review cardiac catheterization from 2017, ? ?EKG:  EKG is normal sinus rhythm, left atrial enlargement, cannot rule out anterior wall MI poor R wave progression anterior precordium, ordered today.  The ekg ordered today demonstrates  ? ?Recent Labs: ?06/06/2021: ALT 19; BUN 18; Creatinine, Ser 0.77; Hemoglobin 12.3; Platelets 244.0; Potassium 4.2; Sodium 141; TSH 2.36  ?Recent Lipid Panel ?   ?Component Value Date/Time  ? CHOL 163 06/06/2021 1057  ? TRIG 81.0 06/06/2021 1057  ? HDL 45.20 06/06/2021 1057  ? CHOLHDL 4 06/06/2021 1057  ? VLDL 16.2 06/06/2021 1057  ? LDLCALC 101 (H) 06/06/2021 1057  ? ? ?Physical Exam:   ? ?VS:  BP 134/70 (BP Location: Right Arm)   Pulse 65   Ht '5\' 1"'$  (1.549 m)   Wt 150 lb (68 kg)   SpO2 98%   BMI 28.34 kg/m?    ? ?Wt Readings from Last 3 Encounters:  ?07/01/21 150 lb (68 kg)  ?06/06/21 148 lb (67.1 kg)  ?05/16/20 156 lb (70.8 kg)  ?  ? ?GEN:  Well nourished, well developed in no acute distress ?HEENT: Normal ?NECK: No JVD; No carotid bruits ?LYMPHATICS: No lymphadenopathy ?CARDIAC: RRR, no murmurs, no rubs, no gallops ?RESPIRATORY:  Clear to auscultation without rales, wheezing or rhonchi  ?ABDOMEN: Soft, non-tender, non-distended ?MUSCULOSKELETAL:  No edema; No deformity  ?SKIN: Warm and  dry ?NEUROLOGIC:  Alert and oriented x 3 ?PSYCHIATRIC:  Normal affect  ? ?ASSESSMENT:   ? ?1. Primary hypertension   ?2. Coronary artery disease involving native coronary artery of native heart without angina pectoris   ?3. Mixed hyperlipidemia   ? ?PLAN:   ? ?In order of problems listed above: ? ?Coronary disease cardiac catheterization 2017 showing small intermediate branch disease which were not amendable for intervention.  She did have residual LAD disease.  She has been on antiplatelet therapy on Plavix.  Not exactly sure why she said she was on aspirin for years then switched to Plavix.  Seems  to be tolerating this medication well.  I will ask her to have an echocardiogram done to recheck left ventricle ejection fraction. ?Essential hypertension blood pressure well controlled today we will continue present management. ?Dyslipidemia I did review her K PN which show me her LDL of 101 and HDL of 45.  She is on Crestor 40 which is high intense statin I will add Zetia 10 to medical regiment target LDL for her should be 70 or favorable even less than 55. ?We did talk in length about healthy lifestyle she is exercising on a regular basis which I encouraged her to do we did review basic of Mediterranean diet. ?Because family history as well as her history of premature coronary artery disease I will ask her to have carotic ultrasounds make sure she does not have any significant carotic arterial stenosis. ? ? ?Medication Adjustments/Labs and Tests Ordered: ?Current medicines are reviewed at length with the patient today.  Concerns regarding medicines are outlined above.  ?No orders of the defined types were placed in this encounter. ? ?No orders of the defined types were placed in this encounter. ? ? ?Signed, ?Park Liter, MD, [facility]. ?07/01/2021 9:17 AM    ?[facility] ?

## 2021-07-01 NOTE — Addendum Note (Signed)
Addended by: Edwyna Shell I on: 07/01/2021 09:32 AM ? ? Modules accepted: Orders ? ?

## 2021-07-01 NOTE — Patient Instructions (Signed)
Medication Instructions:  ?Your physician has recommended you make the following change in your medication:  ? ?START: Zetia 10 mg daily ? ?*If you need a refill on your cardiac medications before your next appointment, please call your pharmacy* ? ? ?Lab Work: ?Your physician recommends that you return for lab work in:  ? ?Labs in 6 weeks: Lipid and LFT's ? ?If you have labs (blood work) drawn today and your tests are completely normal, you will receive your results only by: ?MyChart Message (if you have MyChart) OR ?A paper copy in the mail ?If you have any lab test that is abnormal or we need to change your treatment, we will call you to review the results. ? ? ?Testing/Procedures: ?Your physician has requested that you have an echocardiogram. Echocardiography is a painless test that uses sound waves to create images of your heart. It provides your doctor with information about the size and shape of your heart and how well your heart?s chambers and valves are working. This procedure takes approximately one hour. There are no restrictions for this procedure.  ? ?Your physician has requested that you have a carotid duplex. This test is an ultrasound of the carotid arteries in your neck. It looks at blood flow through these arteries that supply the brain with blood. Allow one hour for this exam. There are no restrictions or special instructions.  ? ? ?Follow-Up: ?At [facility], you and your health needs are our priority.  As part of our continuing mission to provide you with exceptional heart care, we have created designated Provider Care Teams.  These Care Teams include your primary Cardiologist (physician) and Advanced Practice Providers (APPs -  Physician Assistants and Nurse Practitioners) who all work together to provide you with the care you need, when you need it. ? ?We recommend signing up for the patient portal called "MyChart".  Sign up information is provided on this After Visit Summary.  MyChart is  used to connect with patients for Virtual Visits (Telemedicine).  Patients are able to view lab/test results, encounter notes, upcoming appointments, etc.  Non-urgent messages can be sent to your provider as well.   ?To learn more about what you can do with MyChart, go to [email or web address].   ? ?Your next appointment:   ?6 month(s) ? ?The format for your next appointment:   ?In Person ? ?Provider:   ?Jenne Campus, MD{ ? ?Other Instructions ?None  ?

## 2021-07-09 ENCOUNTER — Other Ambulatory Visit: Payer: Self-pay

## 2021-07-09 ENCOUNTER — Ambulatory Visit (HOSPITAL_COMMUNITY)
Admission: RE | Admit: 2021-07-09 | Discharge: 2021-07-09 | Disposition: A | Discharge status: Home / Self Care | Payer: Medicare Other | Source: Ambulatory Visit | Attending: Cardiology | Admitting: Cardiology

## 2021-07-09 DIAGNOSIS — E782 Mixed hyperlipidemia: Secondary | ICD-10-CM

## 2021-07-09 DIAGNOSIS — I2510 Atherosclerotic heart disease of native coronary artery without angina pectoris: Secondary | ICD-10-CM | POA: Diagnosis not present

## 2021-07-09 DIAGNOSIS — I10 Essential (primary) hypertension: Secondary | ICD-10-CM | POA: Diagnosis not present

## 2021-07-09 NOTE — Progress Notes (Signed)
Carotid duplex has been completed.  ? ?Preliminary results in CV Proc.  ? ?Tiffany Powers ?07/09/2021 1:24 PM    ?

## 2021-07-11 ENCOUNTER — Telehealth: Payer: Self-pay

## 2021-07-11 ENCOUNTER — Ambulatory Visit (HOSPITAL_BASED_OUTPATIENT_CLINIC_OR_DEPARTMENT_OTHER)
Admission: RE | Admit: 2021-07-11 | Discharge: 2021-07-11 | Disposition: A | Discharge status: Home / Self Care | Payer: Medicare Other | Source: Ambulatory Visit | Attending: Cardiology | Admitting: Cardiology

## 2021-07-11 DIAGNOSIS — R0609 Other forms of dyspnea: Secondary | ICD-10-CM | POA: Diagnosis not present

## 2021-07-11 DIAGNOSIS — I2510 Atherosclerotic heart disease of native coronary artery without angina pectoris: Secondary | ICD-10-CM | POA: Insufficient documentation

## 2021-07-11 DIAGNOSIS — E782 Mixed hyperlipidemia: Secondary | ICD-10-CM | POA: Diagnosis not present

## 2021-07-11 DIAGNOSIS — I10 Essential (primary) hypertension: Secondary | ICD-10-CM | POA: Diagnosis not present

## 2021-07-11 LAB — ECHOCARDIOGRAM COMPLETE
AR max vel: 1.36 cm2
AV Area VTI: 1.29 cm2
AV Area mean vel: 1.33 cm2
AV Mean grad: 5 mmHg
AV Peak grad: 10.1 mmHg
Ao pk vel: 1.59 m/s
Area-P 1/2: 3.54 cm2
S' Lateral: 2.9 cm

## 2021-07-11 NOTE — Telephone Encounter (Signed)
Patient notified of results.

## 2021-07-11 NOTE — Telephone Encounter (Signed)
-----   Message from Park Liter, MD sent at 07/10/2021 11:18 AM EDT ----- ?Carotid artery show up to 39% stenosis bilaterally ?

## 2021-07-11 NOTE — Progress Notes (Signed)
?  Echocardiogram ?2D Echocardiogram has been performed. ? ?Elmer Ramp ?07/11/2021, 9:32 AM ?

## 2021-08-26 DIAGNOSIS — I2510 Atherosclerotic heart disease of native coronary artery without angina pectoris: Secondary | ICD-10-CM | POA: Diagnosis not present

## 2021-08-26 DIAGNOSIS — I10 Essential (primary) hypertension: Secondary | ICD-10-CM | POA: Diagnosis not present

## 2021-08-26 DIAGNOSIS — Z1211 Encounter for screening for malignant neoplasm of colon: Secondary | ICD-10-CM | POA: Diagnosis not present

## 2021-08-29 DIAGNOSIS — H04123 Dry eye syndrome of bilateral lacrimal glands: Secondary | ICD-10-CM | POA: Diagnosis not present

## 2021-08-29 DIAGNOSIS — H02821 Cysts of right upper eyelid: Secondary | ICD-10-CM | POA: Diagnosis not present

## 2021-08-29 DIAGNOSIS — H02824 Cysts of left upper eyelid: Secondary | ICD-10-CM | POA: Diagnosis not present

## 2021-08-29 DIAGNOSIS — H02825 Cysts of left lower eyelid: Secondary | ICD-10-CM | POA: Diagnosis not present

## 2021-09-10 ENCOUNTER — Ambulatory Visit: Payer: Medicare Other | Admitting: Obstetrics and Gynecology

## 2021-09-10 ENCOUNTER — Encounter: Payer: Self-pay | Admitting: Obstetrics and Gynecology

## 2021-09-10 VITALS — BP 123/72 | HR 84 | Resp 12 | Ht 61.0 in | Wt 148.0 lb

## 2021-09-10 DIAGNOSIS — N812 Incomplete uterovaginal prolapse: Secondary | ICD-10-CM | POA: Diagnosis not present

## 2021-09-10 DIAGNOSIS — N8110 Cystocele, unspecified: Secondary | ICD-10-CM | POA: Diagnosis not present

## 2021-09-10 NOTE — Progress Notes (Unsigned)
[facility] New Patient Evaluation and Consultation  Referring Provider: Darreld Mclean, MD PCP: Darreld Mclean, MD Date of Service: 09/10/2021  SUBJECTIVE Chief Complaint: New Patient (Initial Visit) (Patient referred by Dr. Lorelei Pont for prolapse of bladder/ec/Symptoms of urinary retention )  History of Present Illness: Tiffany Powers is a 66 y.o. Black or African-American female seen in consultation at the request of Dr. Lorelei Pont for evaluation of prolapse.    Review of records significant for: Has bladder prolapse, becoming more bothersome.   Urinary Symptoms: Does not leak urine.   Day time voids 6.  Nocturia: 3 times per night to void. Voiding dysfunction: she does not empty her bladder well.  does not use a catheter to empty bladder.  When urinating, she feels difficulty starting urine stream  UTIs:  0  UTI's in the last year.   Denies history of blood in urine and kidney or bladder stones  Pelvic Organ Prolapse Symptoms:                  She Admits to a feeling of a bulge the vaginal area. It has been present for 1 year.  She Admits to seeing a bulge.  This bulge is bothersome- symptoms come and go. 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   Bowel Symptom: Bowel movements: 1 time(s) per day Stool consistency: soft  Straining: no.  Splinting: no.  Incomplete evacuation: no.  She Denies accidental bowel leakage / fecal incontinence Bowel regimen: none Last colonoscopy: Date- 2013, Results- negative  Sexual Function Sexually active: no.   Pelvic Pain Denies pelvic pain   Past Medical History:  Past Medical History:  Diagnosis Date   Diabetes mellitus without complication ([place])    High cholesterol    Hypertension    MI (myocardial infarction) ([place]) 2001   NSTEMI (non-ST elevated myocardial infarction) ([place]) 12/07/2015     Past Surgical History:   Past Surgical History:  Procedure  Laterality Date   CARDIAC CATHETERIZATION  2001; 2002; 12/06/2105   CARDIAC CATHETERIZATION N/A 12/07/2015   Procedure: Left Heart Cath and Coronary Angiography;  Surgeon: [name] Prows, MD;  Location: [facility];  Service: Cardiovascular;  Laterality: N/A;   TUBAL LIGATION  1980s     Past OB/GYN History: OB History  Gravida Para Term Preterm AB Living  '1 1 1     1  '$ SAB IAB Ectopic Multiple Live Births               # Outcome Date GA Lbr Len/2nd Weight Sex Delivery Anes PTL Lv  1 Term      Vag-Spont       Menopausal: Denies vaginal bleeding since menopause Last pap smear was 2020- negative.     Medications: She has a current medication list which includes the following prescription(s): alprazolam, clopidogrel, cyanocobalamin, ezetimibe, hydrochlorothiazide, nitroglycerin, rosuvastatin, and telmisartan.   Allergies: Patient has No Known Allergies.   Social History:  Social History   Tobacco Use   Smoking status: Never    Passive exposure: Never   Smokeless tobacco: Never  Vaping Use   Vaping Use: Never used  Substance Use Topics   Drug use: No    Relationship status: divorced She lives with mother and sisters.   She is not employed. Regular exercise: Yes: walking, cardio History of abuse: No  Family History:   Family History  Problem Relation Age of Onset  CAD Father 48       MI   CAD Brother 8       MI   CAD Maternal Grandmother 65       CABG     Review of Systems: Review of Systems  Constitutional:  Negative for fever, malaise/fatigue and weight loss.  Respiratory:  Negative for cough, shortness of breath and wheezing.   Cardiovascular:  Negative for chest pain, palpitations and leg swelling.  Gastrointestinal:  Negative for abdominal pain and blood in stool.  Genitourinary:  Negative for dysuria.  Musculoskeletal:  Negative for myalgias.  Skin:  Negative for rash.  Neurological:  Negative for dizziness and headaches.  Endo/Heme/Allergies:   Does not bruise/bleed easily.  Psychiatric/Behavioral:  Negative for depression. The patient is not nervous/anxious.     OBJECTIVE Physical Exam: Vitals:   09/10/21 1544  BP: 123/72  Pulse: 84  Resp: 12  SpO2: 97%  Weight: 148 lb (67.1 kg)  Height: '5\' 1"'$  (1.549 m)    Physical Exam Constitutional:      General: She is not in acute distress. Pulmonary:     Effort: Pulmonary effort is normal.  Abdominal:     General: There is no distension.     Palpations: Abdomen is soft.     Tenderness: There is no abdominal tenderness. There is no rebound.  Musculoskeletal:        General: No swelling. Normal range of motion.  Skin:    General: Skin is warm and dry.     Findings: No rash.  Neurological:     Mental Status: She is alert and oriented to person, place, and time.  Psychiatric:        Mood and Affect: Mood normal.        Behavior: Behavior normal.     GU / Detailed Urogynecologic Evaluation:  Pelvic Exam: Normal external female genitalia; Bartholin's and Skene's glands normal in appearance; urethral meatus normal in appearance, no urethral masses or discharge.   CST: negative  Speculum exam reveals normal vaginal mucosa with atrophy. Cervix normal appearance. Uterus normal single, nontender. Adnexa no mass, fullness, tenderness.    Pelvic floor strength I/V  Pelvic floor musculature: Right levator non-tender, Right obturator non-tender, Left levator non-tender, Left obturator non-tender  POP-Q:   POP-Q  1                                            Aa   1                                           Ba  -4.5                                              C   4.5                                            Gh  3  Pb  7                                            tvl   -2                                            Ap  -2                                            Bp  -5                                              D      Rectal Exam:  Normal external rectum  Post-Void Residual (PVR) by Bladder Scan: Unable to void, PVR not checked  Laboratory Results: Unable to provide urine sample  ASSESSMENT AND PLAN Tiffany Powers is a 66 y.o. with:  1. Uterovaginal prolapse, incomplete   2. Prolapse of anterior vaginal wall    Stage II anterior, Stage I posterior, Stage I apical prolapse - For treatment of pelvic organ prolapse, we discussed options for management including expectant management, conservative management, and surgical management, such as Kegels, a pessary, pelvic floor physical therapy, and specific surgical procedures. - We discussed that she is not currently a good surgical candidate since she takes care of her mother and would not be able to follow the post-operative lifting restrictions. She would also potentially be at risk for an MI and would need to have cardiac testing/ clearance prior to surgical intervention.  - At this time, she wants to try a pessary, will return for a fitting.   Jaquita Folds, MD

## 2021-09-11 ENCOUNTER — Encounter: Payer: Self-pay | Admitting: Obstetrics and Gynecology

## 2021-09-17 ENCOUNTER — Encounter: Payer: Self-pay | Admitting: Obstetrics and Gynecology

## 2021-09-17 ENCOUNTER — Ambulatory Visit: Payer: Medicare Other | Admitting: Obstetrics and Gynecology

## 2021-09-17 VITALS — BP 124/68 | HR 81

## 2021-09-17 DIAGNOSIS — N8110 Cystocele, unspecified: Secondary | ICD-10-CM | POA: Diagnosis not present

## 2021-09-17 NOTE — Patient Instructions (Signed)
You were fitted with a #4 ring with support pessary. Return in a few weeks to see how it is working.  You can take it out every week, or sooner if you desire. Clean with liquid soap and water in between uses and leave out over night on the night that you remove it. Call for any problems.

## 2021-09-17 NOTE — Progress Notes (Signed)
[facility]   Subjective:     Chief Complaint: Pessary Check  History of Present Illness: Tiffany Powers is a 66 y.o. female with stage II pelvic organ prolapse who presents today for a pessary fitting.   Past Medical History: Patient  has a past medical history of Diabetes mellitus without complication ([place]), High cholesterol, Hypertension, MI (myocardial infarction) ([place]) (2001), and NSTEMI (non-ST elevated myocardial infarction) ([place]) (12/07/2015).   Past Surgical History: She  has a past surgical history that includes Tubal ligation (1980s); Cardiac catheterization (2001; 2002; 12/06/2105); and Cardiac catheterization (N/A, 12/07/2015).   Medications: She has a current medication list which includes the following prescription(s): alprazolam, clopidogrel, cyanocobalamin, ezetimibe, hydrochlorothiazide, nitroglycerin, rosuvastatin, and telmisartan.   Allergies: Patient has No Known Allergies.   Social History: Patient  reports that she has never smoked. She has never been exposed to tobacco smoke. She has never used smokeless tobacco. She reports that she does not use drugs.      Objective:    BP 124/68   Pulse 81  Gen: No apparent distress, A&O x 3. Pelvic Exam: Normal external female genitalia; Bartholin's and Skene's glands normal in appearance; urethral meatus normal in appearance, no urethral masses or discharge.   A size #3 ring with support pessary was fitted. This fell out with sitting on the toilet. A #4 ring with support was fitted.  It was comfortable, stayed in place with valsalva and was an appropriate size on examination, with one finger fitting between the pessary and the vaginal walls. She demonstrated proper removal and replacement. Lot#[id number]  POP-Q (08/13/21):    POP-Q   1                                            Aa   1                                           Ba   -4.5                                              C    4.5                                             Gh   3                                            Pb   7                                            tvl    -2  Ap   -2                                            Bp   -5                                              D        Assessment/Plan:    Assessment: Tiffany Powers is a 66 y.o. with stage II pelvic organ prolapse who presents for a pessary fitting. Plan: She was fitted with a #3 ring with support pessary. She will remove at least weekly, and clean with soap and water .   Follow-up in a few weeks for a pessary check or sooner as needed.  All questions were answered.    Jaquita Folds, MD

## 2021-09-18 ENCOUNTER — Telehealth: Payer: Self-pay | Admitting: Family Medicine

## 2021-09-18 NOTE — Telephone Encounter (Signed)
Received a clearance form from gastroenterology, Dr. Benson Norway regarding colonoscopy and interruption of Plavix.  I called the GI office, I can provide her anesthesia clearance, she is low risk for propofol.  I will defer to cardiology regarding Plavix.  Gastroenterology will reach out to her cardiologist as well

## 2021-09-24 ENCOUNTER — Telehealth: Payer: Self-pay

## 2021-09-24 ENCOUNTER — Telehealth: Payer: Self-pay | Admitting: *Deleted

## 2021-09-24 NOTE — Telephone Encounter (Signed)
Primary Cardiologist:Robert Agustin Cree, MD  Chart reviewed as part of pre-operative protocol coverage. Because of Tiffany Powers's past medical history and time since last visit, he/she will require a virtual visit/telephone call in order to better assess preoperative cardiovascular risk.  Pre-op covering staff: - Please contact patient, obtain consent, and schedule appointment   There is no request to hold antiplatelet/anticoagulation.  [name], NP-C    09/24/2021, 2:31 PM [place] [address] Office [phone] Fax [phone]

## 2021-09-24 NOTE — Telephone Encounter (Signed)
Spoke with patient and scheduled her for a pre-op clearance telehealth appointment for tomorrow 09/25/2021 at 9:40 AM.

## 2021-09-24 NOTE — Telephone Encounter (Signed)
   Pre-operative Risk Assessment    Patient Name: Tiffany Powers  DOB: 12-15-1955 MRN: [id number]      Request for Surgical Clearance    Procedure:   colonoscopy  Date of Surgery:  Clearance 10/01/21                                 Surgeon:  Dr. Irine Seal Group or Practice Name:  [facility], [place] Phone number:  [phone] Fax number:  [phone]   Type of Clearance Requested:   - Medical    Type of Anesthesia:   Propofol   Additional requests/questions:    Signed, Lowella Grip   09/24/2021, 1:24 PM

## 2021-09-24 NOTE — Telephone Encounter (Signed)
  Patient Consent for Virtual Visit        Tiffany Powers has provided verbal consent on 09/24/2021 for a virtual visit (video or telephone).   CONSENT FOR VIRTUAL VISIT FOR:  Tiffany Powers  By participating in this virtual visit I agree to the following:  I hereby voluntarily request, consent and authorize [facility] and its employed or contracted physicians, physician assistants, nurse practitioners or other licensed health care professionals (the Practitioner), to provide me with telemedicine health care services (the "Services") as deemed necessary by the treating Practitioner. I acknowledge and consent to receive the Services by the Practitioner via telemedicine. I understand that the telemedicine visit will involve communicating with the Practitioner through live audiovisual communication technology and the disclosure of certain medical information by electronic transmission. I acknowledge that I have been given the opportunity to request an in-person assessment or other available alternative prior to the telemedicine visit and am voluntarily participating in the telemedicine visit.  I understand that I have the right to withhold or withdraw my consent to the use of telemedicine in the course of my care at any time, without affecting my right to future care or treatment, and that the Practitioner or I may terminate the telemedicine visit at any time. I understand that I have the right to inspect all information obtained and/or recorded in the course of the telemedicine visit and may receive copies of available information for a reasonable fee.  I understand that some of the potential risks of receiving the Services via telemedicine include:  Delay or interruption in medical evaluation due to technological equipment failure or disruption; Information transmitted may not be sufficient (e.g. poor resolution of images) to allow for appropriate medical decision making by the Practitioner; and/or   In rare instances, security protocols could fail, causing a breach of personal health information.  Furthermore, I acknowledge that it is my responsibility to provide information about my medical history, conditions and care that is complete and accurate to the best of my ability. I acknowledge that Practitioner's advice, recommendations, and/or decision may be based on factors not within their control, such as incomplete or inaccurate data provided by me or distortions of diagnostic images or specimens that may result from electronic transmissions. I understand that the practice of medicine is not an exact science and that Practitioner makes no warranties or guarantees regarding treatment outcomes. I acknowledge that a copy of this consent can be made available to me via my patient portal ([facility]), or I can request a printed copy by calling the office of [facility].    I understand that my insurance will be billed for this visit.   I have read or had this consent read to me. I understand the contents of this consent, which adequately explains the benefits and risks of the Services being provided via telemedicine.  I have been provided ample opportunity to ask questions regarding this consent and the Services and have had my questions answered to my satisfaction. I give my informed consent for the services to be provided through the use of telemedicine in my medical care

## 2021-09-25 ENCOUNTER — Encounter: Payer: Self-pay | Admitting: Nurse Practitioner

## 2021-09-25 ENCOUNTER — Ambulatory Visit (INDEPENDENT_AMBULATORY_CARE_PROVIDER_SITE_OTHER): Payer: Medicare Other | Admitting: Nurse Practitioner

## 2021-09-25 DIAGNOSIS — Z01810 Encounter for preprocedural cardiovascular examination: Secondary | ICD-10-CM

## 2021-09-25 NOTE — Progress Notes (Signed)
Virtual Visit via Telephone Note   Because of Tiffany Powers's co-morbid illnesses, she is at least at moderate risk for complications without adequate follow up.  This format is felt to be most appropriate for this patient at this time.  The patient did not have access to video technology/had technical difficulties with video requiring transitioning to audio format only (telephone).  All issues noted in this document were discussed and addressed.  No physical exam could be performed with this format.  Please refer to the patient's chart for her consent to telehealth for [facility].  Evaluation Performed:  Preoperative cardiovascular risk assessment _____________   Date:  09/25/2021   Patient ID:  Tiffany Powers, DOB Dec 10, 1955, MRN [id number] Patient Location:  Home Provider location:   Office  Primary Care Provider:  Darreld Mclean, MD Primary Cardiologist:  Jenne Campus, MD  Chief Complaint / Patient Profile   66 y.o. y/o female with a h/o CAD with cardiac catheterization 2017 showing small intermediate branch disease not amendable for intervention, residual LAD disease, hypertension, hyperlipidemia, who is pending colonoscopy and presents today for telephonic preoperative cardiovascular risk assessment.  Past Medical History    Past Medical History:  Diagnosis Date   Diabetes mellitus without complication ([place])    High cholesterol    Hypertension    MI (myocardial infarction) ([place]) 2001   NSTEMI (non-ST elevated myocardial infarction) ([place]) 12/07/2015   Past Surgical History:  Procedure Laterality Date   CARDIAC CATHETERIZATION  2001; 2002; 12/06/2105   CARDIAC CATHETERIZATION N/A 12/07/2015   Procedure: Left Heart Cath and Coronary Angiography;  Surgeon: [name] Prows, MD;  Location: [facility];  Service: Cardiovascular;  Laterality: N/A;   TUBAL LIGATION  1980s    Allergies  No Known Allergies  History of Present Illness    Tiffany Powers is a 66 y.o.  female who presents via audio/video conferencing for a telehealth visit today.  Pt was last seen in cardiology clinic on 07/01/2021 by Dr. Agustin Cree. At that time Idil Maslanka was doing well.  The patient is now pending procedure as outlined above. Since her last visit, she denies chest pain, shortness of breath, lower extremity edema, fatigue, palpitations, melena, hematuria, hemoptysis, diaphoresis, weakness, presyncope, syncope, orthopnea, and PND. She is walking 5 miles per day without any symptoms and is overall feeling well.   Home Medications    Prior to Admission medications   Medication Sig Start Date End Date Taking? Authorizing Provider  ALPRAZolam (XANAX) 0.25 MG tablet Take 1 tablet (0.25 mg total) by mouth at bedtime. Use as needed for sleep 06/06/21   Copland, Gay Filler, MD  clopidogrel (PLAVIX) 75 MG tablet Take 1 tablet (75 mg total) by mouth daily. 06/06/21   Copland, Gay Filler, MD  Cyanocobalamin (VITAMIN B-12 PO) Take 1 tablet by mouth daily.    [provider]  ezetimibe (ZETIA) 10 MG tablet Take 1 tablet (10 mg total) by mouth daily. 07/01/21   Park Liter, MD  hydrochlorothiazide (HYDRODIURIL) 25 MG tablet Take 1 tablet (25 mg total) by mouth daily. 06/06/21   Copland, Gay Filler, MD  nitroGLYCERIN (NITROSTAT) 0.4 MG SL tablet Place 1 tablet (0.4 mg total) under the tongue every 5 (five) minutes as needed for chest pain. 06/06/21   Copland, Gay Filler, MD  rosuvastatin (CRESTOR) 40 MG tablet Take 1 tablet (40 mg total) by mouth daily. 06/06/21   Copland, Gay Filler, MD  telmisartan (MICARDIS) 40 MG tablet Take 1 tablet (40 mg  total) by mouth daily. 06/06/21   Copland, Gay Filler, MD    Physical Exam    Vital Signs:  Elly Haffey does not have vital signs available for review today.  Given telephonic nature of communication, physical exam is limited. AAOx3. NAD. Normal affect.  Speech and respirations are unlabored.  Accessory Clinical Findings     None  Assessment & Plan    1.  Preoperative Cardiovascular Risk Assessment: She is doing well from a cardiac perspective and may proceed without further testing. According to the Revised Cardiac Risk Index (RCRI), her Perioperative Risk of Major Cardiac Event is (%): 0.4. Her Functional Capacity in METs is: 7.59 according to the [employer] Activity Status Index (DASI).  We did not receive a formal request to hold Plavix.  Patient was advised to hold Plavix 5 days before procedure.  I advised that we are in agreement that she do so from a cardiac perspective.   A copy of this note will be routed to requesting surgeon.  Time:   Today, I have spent 10 minutes with the patient with telehealth technology discussing medical history, symptoms, and management plan.     [name], NP-C    09/25/2021, 9:43 AM [place] [address], [address] Office [phone] Fax [phone]

## 2021-10-01 ENCOUNTER — Encounter: Payer: Self-pay | Admitting: *Deleted

## 2021-10-01 DIAGNOSIS — K5730 Diverticulosis of large intestine without perforation or abscess without bleeding: Secondary | ICD-10-CM | POA: Diagnosis not present

## 2021-10-01 DIAGNOSIS — D120 Benign neoplasm of cecum: Secondary | ICD-10-CM | POA: Diagnosis not present

## 2021-10-01 DIAGNOSIS — Z1211 Encounter for screening for malignant neoplasm of colon: Secondary | ICD-10-CM | POA: Diagnosis not present

## 2021-10-01 DIAGNOSIS — Z86010 Personal history of colonic polyps: Secondary | ICD-10-CM | POA: Diagnosis not present

## 2021-10-01 DIAGNOSIS — K635 Polyp of colon: Secondary | ICD-10-CM | POA: Diagnosis not present

## 2021-10-01 DIAGNOSIS — K6389 Other specified diseases of intestine: Secondary | ICD-10-CM | POA: Diagnosis not present

## 2021-10-01 LAB — HM COLONOSCOPY

## 2021-10-08 ENCOUNTER — Ambulatory Visit: Payer: Medicare Other | Admitting: Obstetrics and Gynecology

## 2021-11-06 ENCOUNTER — Encounter: Payer: Self-pay | Admitting: Family Medicine

## 2021-11-06 DIAGNOSIS — F4323 Adjustment disorder with mixed anxiety and depressed mood: Secondary | ICD-10-CM

## 2021-11-07 MED ORDER — ALPRAZOLAM 0.25 MG PO TABS: 0.2500 mg | ORAL_TABLET | Freq: Every day | ORAL | 1 refills | Status: DC

## 2021-12-02 ENCOUNTER — Ambulatory Visit (INDEPENDENT_AMBULATORY_CARE_PROVIDER_SITE_OTHER): Payer: Medicare Other

## 2021-12-02 VITALS — Ht 61.0 in | Wt 148.0 lb

## 2021-12-02 DIAGNOSIS — Z0000 Encounter for general adult medical examination without abnormal findings: Secondary | ICD-10-CM | POA: Diagnosis not present

## 2021-12-02 NOTE — Patient Instructions (Signed)
[name] , Thank you for taking time to complete your Medicare Wellness Visit. I appreciate your ongoing commitment to your health goals. Please review the following plan we discussed and let me know if I can assist you in the future.   Screening recommendations/referrals: Colonoscopy: Completed 10/01/2021-Due 10/01/2028 Mammogram: Completed 12/31/2020-Due 12/31/2021 Bone Density: Completed 12/31/2020-Due 01/01/2023 Recommended yearly ophthalmology/optometry visit for glaucoma screening and checkup Recommended yearly dental visit for hygiene and checkup  Vaccinations: Influenza vaccine: Up to date Pneumococcal vaccine: Up to date Tdap vaccine: Up to date Shingles vaccine: Completed vaccines-please bring dates to your next appt   Covid-19:Up to date  Advanced directives: Please bring a copy of Living Will and/or [place] for your chart.   Conditions/risks identified: See problem list  Next appointment: Follow up in one year for your annual wellness visit    Preventive Care 67 Years and Older, Female Preventive care refers to lifestyle choices and visits with your health care provider that can promote health and wellness. What does preventive care include? A yearly physical exam. This is also called an annual well check. Dental exams once or twice a year. Routine eye exams. Ask your health care provider how often you should have your eyes checked. Personal lifestyle choices, including: Daily care of your teeth and gums. Regular physical activity. Eating a healthy diet. Avoiding tobacco and drug use. Limiting alcohol use. Practicing safe sex. Taking low-dose aspirin every day. Taking vitamin and mineral supplements as recommended by your health care provider. What happens during an annual well check? The services and screenings done by your health care provider during your annual well check will depend on your age, overall health, lifestyle risk factors, and  family history of disease. Counseling  Your health care provider may ask you questions about your: Alcohol use. Tobacco use. Drug use. Emotional well-being. Home and relationship well-being. Sexual activity. Eating habits. History of falls. Memory and ability to understand (cognition). Work and work Statistician. Reproductive health. Screening  You may have the following tests or measurements: Height, weight, and BMI. Blood pressure. Lipid and cholesterol levels. These may be checked every 5 years, or more frequently if you are over 16 years old. Skin check. Lung cancer screening. You may have this screening every year starting at age 82 if you have a 30-pack-year history of smoking and currently smoke or have quit within the past 15 years. Fecal occult blood test (FOBT) of the stool. You may have this test every year starting at age 27. Flexible sigmoidoscopy or colonoscopy. You may have a sigmoidoscopy every 5 years or a colonoscopy every 10 years starting at age 66. Hepatitis C blood test. Hepatitis B blood test. Sexually transmitted disease (STD) testing. Diabetes screening. This is done by checking your blood sugar (glucose) after you have not eaten for a while (fasting). You may have this done every 1-3 years. Bone density scan. This is done to screen for osteoporosis. You may have this done starting at age 44. Mammogram. This may be done every 1-2 years. Talk to your health care provider about how often you should have regular mammograms. Talk with your health care provider about your test results, treatment options, and if necessary, the need for more tests. Vaccines  Your health care provider may recommend certain vaccines, such as: Influenza vaccine. This is recommended every year. Tetanus, diphtheria, and acellular pertussis (Tdap, Td) vaccine. You may need a Td booster every 10 years. Zoster vaccine. You may need this after  age 73. Pneumococcal 13-valent conjugate  (PCV13) vaccine. One dose is recommended after age 30. Pneumococcal polysaccharide (PPSV23) vaccine. One dose is recommended after age 73. Talk to your health care provider about which screenings and vaccines you need and how often you need them. This information is not intended to replace advice given to you by your health care provider. Make sure you discuss any questions you have with your health care provider. Document Released: 04/27/2015 Document Revised: 12/19/2015 Document Reviewed: 01/30/2015 Elsevier Interactive Patient Education  2017 [place] Prevention in the Home Falls can cause injuries. They can happen to people of all ages. There are many things you can do to make your home safe and to help prevent falls. What can I do on the outside of my home? Regularly fix the edges of walkways and driveways and fix any cracks. Remove anything that might make you trip as you walk through a door, such as a raised step or threshold. Trim any bushes or trees on the path to your home. Use bright outdoor lighting. Clear any walking paths of anything that might make someone trip, such as rocks or tools. Regularly check to see if handrails are loose or broken. Make sure that both sides of any steps have handrails. Any raised decks and porches should have guardrails on the edges. Have any leaves, snow, or ice cleared regularly. Use sand or salt on walking paths during winter. Clean up any spills in your garage right away. This includes oil or grease spills. What can I do in the bathroom? Use night lights. Install grab bars by the toilet and in the tub and shower. Do not use towel bars as grab bars. Use non-skid mats or decals in the tub or shower. If you need to sit down in the shower, use a plastic, non-slip stool. Keep the floor dry. Clean up any water that spills on the floor as soon as it happens. Remove soap buildup in the tub or shower regularly. Attach bath mats securely with  double-sided non-slip rug tape. Do not have throw rugs and other things on the floor that can make you trip. What can I do in the bedroom? Use night lights. Make sure that you have a light by your bed that is easy to reach. Do not use any sheets or blankets that are too big for your bed. They should not hang down onto the floor. Have a firm chair that has side arms. You can use this for support while you get dressed. Do not have throw rugs and other things on the floor that can make you trip. What can I do in the kitchen? Clean up any spills right away. Avoid walking on wet floors. Keep items that you use a lot in easy-to-reach places. If you need to reach something above you, use a strong step stool that has a grab bar. Keep electrical cords out of the way. Do not use floor polish or wax that makes floors slippery. If you must use wax, use non-skid floor wax. Do not have throw rugs and other things on the floor that can make you trip. What can I do with my stairs? Do not leave any items on the stairs. Make sure that there are handrails on both sides of the stairs and use them. Fix handrails that are broken or loose. Make sure that handrails are as long as the stairways. Check any carpeting to make sure that it is firmly attached to the stairs.  Fix any carpet that is loose or worn. Avoid having throw rugs at the top or bottom of the stairs. If you do have throw rugs, attach them to the floor with carpet tape. Make sure that you have a light switch at the top of the stairs and the bottom of the stairs. If you do not have them, ask someone to add them for you. What else can I do to help prevent falls? Wear shoes that: Do not have high heels. Have rubber bottoms. Are comfortable and fit you well. Are closed at the toe. Do not wear sandals. If you use a stepladder: Make sure that it is fully opened. Do not climb a closed stepladder. Make sure that both sides of the stepladder are locked  into place. Ask someone to hold it for you, if possible. Clearly mark and make sure that you can see: Any grab bars or handrails. First and last steps. Where the edge of each step is. Use tools that help you move around (mobility aids) if they are needed. These include: Canes. Walkers. Scooters. Crutches. Turn on the lights when you go into a dark area. Replace any light bulbs as soon as they burn out. Set up your furniture so you have a clear path. Avoid moving your furniture around. If any of your floors are uneven, fix them. If there are any pets around you, be aware of where they are. Review your medicines with your doctor. Some medicines can make you feel dizzy. This can increase your chance of falling. Ask your doctor what other things that you can do to help prevent falls. This information is not intended to replace advice given to you by your health care provider. Make sure you discuss any questions you have with your health care provider. Document Released: 01/25/2009 Document Revised: 09/06/2015 Document Reviewed: 05/05/2014 Elsevier Interactive Patient Education  2017 [employer].

## 2021-12-02 NOTE — Progress Notes (Signed)
Subjective:   Tiffany Powers is a 66 y.o. female who presents for an Initial Medicare Annual Wellness Visit.  I connected with Tiffany Powers today by telephone and verified that I am speaking with the correct person using two identifiers. Location patient: home Location provider: work Persons participating in the virtual visit: patient, Marine scientist.    I discussed the limitations, risks, security and privacy concerns of performing an evaluation and management service by telephone and the availability of in person appointments. I also discussed with the patient that there may be a patient responsible charge related to this service. The patient expressed understanding and verbally consented to this telephonic visit.    Interactive audio and video telecommunications were attempted between this provider and patient, however failed, due to patient having technical difficulties OR patient did not have access to video capability.  We continued and completed visit with audio only.  Some vital signs may be absent or patient reported.   Time Spent with patient on telephone encounter: 20 minutes   Review of Systems     Cardiac Risk Factors include: advanced age (>10mn, >>47women);diabetes mellitus;dyslipidemia;hypertension     Objective:    Today's Vitals   12/02/21 1232  Weight: 148 lb (67.1 kg)  Height: '5\' 1"'$  (1.549 m)   Body mass index is 27.96 kg/m.     12/02/2021   12:35 PM 12/07/2015    8:54 PM  Advanced Directives  Does Patient Have a Medical Advance Directive? Yes No  Type of Advance Directive HAyrin Chart? No - copy requested   Would patient like information on creating a medical advance directive?  Yes - Educational materials given    Current Medications (verified) Outpatient Encounter Medications as of 12/02/2021  Medication Sig   ALPRAZolam (XANAX) 0.25 MG tablet Take 1 tablet (0.25 mg total) by mouth at bedtime. Use  as needed for sleep   clopidogrel (PLAVIX) 75 MG tablet Take 1 tablet (75 mg total) by mouth daily.   Cyanocobalamin (VITAMIN B-12 PO) Take 1 tablet by mouth daily.   ezetimibe (ZETIA) 10 MG tablet Take 1 tablet (10 mg total) by mouth daily.   hydrochlorothiazide (HYDRODIURIL) 25 MG tablet Take 1 tablet (25 mg total) by mouth daily.   nitroGLYCERIN (NITROSTAT) 0.4 MG SL tablet Place 1 tablet (0.4 mg total) under the tongue every 5 (five) minutes as needed for chest pain.   rosuvastatin (CRESTOR) 40 MG tablet Take 1 tablet (40 mg total) by mouth daily.   telmisartan (MICARDIS) 40 MG tablet Take 1 tablet (40 mg total) by mouth daily.   No facility-administered encounter medications on file as of 12/02/2021.    Allergies (verified) Patient has no known allergies.   History: Past Medical History:  Diagnosis Date   [place] mellitus without complication [place] [place]    High cholesterol    Hypertension    [place] [place]   [place]non[place]ST elevated [place]) [place] 12/07[place]2015   Past Surgical History:  Procedure Laterality Date   CARDIAC CATHETERIZATION  2001; 2002; 12/06/2105   CARDIAC CATHETERIZATION N/A 12/07/2015   Procedure: Left Heart Cath and Coronary Angiography;  Surgeon: JAdrian Prows MD;  [facility];  Service: Cardiovascular;  Laterality: N/A;   TUBAL LIGATION  1980s   Family History  Problem Relation Age of Onset   CAD Father 533      MI   CAD Brother 53  MI   CAD Maternal Grandmother 65       CABG   Social History   Socioeconomic History   Marital status: Divorced    Spouse name: Not on file   Number of children: Not on file   Years of education: Not on file   Highest education level: Not on file  Occupational History   Not on file  Tobacco Use   Smoking status: Never    Passive exposure: Never   Smokeless tobacco: Never  Vaping Use   Vaping Use: Never used  Substance and Sexual Activity   Alcohol use: Yes     Alcohol/week: 1.0 standard drink of alcohol    Types: 1 Glasses of wine per week    Comment: occasionally   Drug use: No   Sexual activity: Not Currently    Birth control/protection: Post-menopausal  Other Topics Concern   Not on file  Social History Narrative   Not on file   Social Determinants of Health   Financial Resource Strain: Low Risk  (12/02/2021)   Overall Financial Resource Strain (CARDIA)    Difficulty of Paying Living Expenses: Not hard at all  Food Insecurity: No Food Insecurity (12/02/2021)   Hunger Vital Sign    Worried About Running Out of Food in the Last Year: Never true    Ran Out of Food in the Last Year: Never true  Transportation Needs: No Transportation Needs (12/02/2021)   PRAPARE - Hydrologist (Medical): No    Lack of Transportation (Non-Medical): No  Physical Activity: Sufficiently Active (12/02/2021)   Exercise Vital Sign    Days of Exercise per Week: 6 days    Minutes of Exercise per Session: 40 min  Stress: No Stress Concern Present (12/02/2021)   [place]    Feeling of Stress : Not at all  Social Connections: Moderately Integrated (12/02/2021)   Social Connection and Isolation Panel [NHANES]    Frequency of Communication with Friends and Family: More than three times a week    Frequency of Social Gatherings with Friends and Family: More than three times a week    Attends Religious Services: More than 4 times per year    Active Member of [employer] or Organizations: Yes    Attends Music therapist: More than 4 times per year    Marital Status: Divorced    Tobacco Counseling Counseling given: Not Answered   Clinical Intake:  Pre-visit preparation completed: Yes  Pain : No/denies pain     BMI - recorded: 27.96 Nutritional Status: BMI 25 -29 Overweight Nutritional Risks: None Diabetes: Yes CBG done?: No Did pt. bring in CBG monitor  from home?: No  How often do you need to have someone help you when you read instructions, pamphlets, or other written materials from your doctor or pharmacy?: 1 - Never  Diabetes:  Is the patient diabetic?  Yes  If diabetic, was a CBG obtained today?  No  Did the patient bring in their glucometer from home?  No phone visit How often do you monitor your CBG's? never.   Financial Strains and Diabetes Management:  Are you having any financial strains with the device, your supplies or your medication? No .  Does the patient want to be seen by Chronic Care Management for management of their diabetes?  No  Would the patient like to be referred to a Nutritionist or for Diabetic Management?  No  Diabetic Exams:  Diabetic Eye Exam: Completed 06/2021 per patient.   Diabetic Foot Exam: Completed 06/06/2021.    Interpreter Needed?: No  Information entered by :: Caroleen Hamman LPN   Activities of Daily Living    12/02/2021   12:39 PM  In your present state of health, do you have any difficulty performing the following activities:  Hearing? 0  Vision? 0  Difficulty concentrating or making decisions? 0  Walking or climbing stairs? 0  Dressing or bathing? 0  Doing errands, shopping? 0  Preparing Food and eating ? N  Using the Toilet? N  In the past six months, have you accidently leaked urine? N  Do you have problems with loss of bowel control? N  Managing your Medications? N  Managing your Finances? N  Housekeeping or managing your Housekeeping? N    Patient Care Team: Copland, Gay Filler, MD as PCP - General (Family Medicine) Park Liter, MD as PCP - Cardiology (Cardiology)  Indicate any recent Medical Services you may have received from other than [facility] providers in the past year (date may be approximate).     Assessment:   This is a routine wellness examination for Chaela.  Hearing/Vision screen Hearing Screening - Comments:: No issues Vision Screening -  Comments:: Last eye exam-06/2021-[facility]  Dietary issues and exercise activities discussed: Current Exercise Habits: Home exercise routine, Type of exercise: walking, Time (Minutes): 45, Frequency (Times/Week): 6, Weekly Exercise (Minutes/Week): 270, Intensity: Mild, Exercise limited by: None identified   Goals Addressed             This Visit's Progress    Patient Stated       Continue walking & eating healthy       Depression Screen    12/02/2021   12:38 PM 06/06/2021   10:27 AM  PHQ 2/9 Scores  PHQ - 2 Score 1 0    Fall Risk    12/02/2021   12:37 PM 06/06/2021   10:27 AM  [place] in the past year? 0 0  Number falls in past yr: 0 0  Injury with Fall? 0 0  Follow up Falls prevention discussed     :  Any stairs in or around the home? No  Home free of loose throw rugs in walkways, pet beds, electrical cords, etc? Yes  Adequate lighting in your home to reduce risk of falls? Yes   ASSISTIVE DEVICES UTILIZED TO PREVENT FALLS:  Life alert? No  Use of a cane, walker or w/c? No  Grab bars in the bathroom? No  Shower chair or bench in shower? No  Elevated toilet seat or a handicapped toilet? No   TIMED UP AND GO:  Was the test performed? No . Phone visit   Cognitive Function:Normal cognitive status assessed by this Nurse Health Advisor. No abnormalities found.         12/02/2021   12:43 PM  6CIT Screen  What Year? 0 points  What month? 0 points  What time? 0 points  Count back from 20 0 points  Months in reverse 0 points  Repeat phrase 0 points  Total Score 0 points    Immunizations Immunization History  Administered Date(s) Administered   Fluad Quad(high Dose 65+) 01/25/2021   Influenza-Unspecified 12/27/2019   Janssen (J&J) SARS-COV-2 Vaccination 08/13/2019, 03/14/2020   PNEUMOCOCCAL CONJUGATE-20 06/06/2021   Pfizer Covid-19 Vaccine Bivalent Booster 5y-11y 01/23/2021   Pneumococcal  Polysaccharide-23 02/28/2019  Td 12/30/2017   Tdap 04/14/2006    TDAP status: Up to date  Flu Vaccine status: Up to date  Pneumococcal vaccine status: Up to date  Covid-19 vaccine status: Completed vaccines  Qualifies for Shingles Vaccine? Yes   Zostavax completed No   Shingrix Completed?: No.    Education has been provided regarding the importance of this vaccine. Patient has been advised to call insurance company to determine out of pocket expense if they have not yet received this vaccine. Advised may also receive vaccine at local pharmacy or Health Dept. Verbalized acceptance and understanding.  Screening Tests Health Maintenance  Topic Date Due   OPHTHALMOLOGY EXAM  Never done   Zoster Vaccines- Shingrix (1 of 2) Never done   COVID-19 Vaccine (4 - Additional dose for Janssen series) 05/26/2021   INFLUENZA VACCINE  11/12/2021   HEMOGLOBIN A1C  12/04/2021   FOOT EXAM  06/06/2022   MAMMOGRAM  01/01/2023   TETANUS/TDAP  12/31/2027   COLONOSCOPY (Pts 45-51yr Insurance coverage will need to be confirmed)  10/01/2028   Pneumonia Vaccine 67 Years old  Completed   DEXA SCAN  Completed   Hepatitis C Screening  Completed   HPV VACCINES  Aged Out    Health Maintenance  Health Maintenance Due  Topic Date Due   OPHTHALMOLOGY EXAM  Never done   Zoster Vaccines- Shingrix (1 of 2) Never done   COVID-19 Vaccine (4 - Additional dose for Janssen series) 05/26/2021   INFLUENZA VACCINE  11/12/2021    Colorectal cancer screening: Type of screening: Colonoscopy. Completed 10/01/2021. Repeat every 7 years  Mammogram status: Completed bilateral 12/31/2020. Repeat every year  Bone Density status: Completed 12/31/2020. Results reflect: Bone density results: NORMAL. Repeat every 2 years.  Lung Cancer Screening: (Low Dose CT Chest recommended if Age 67-80years, 30 pack-year currently smoking OR have quit w/in 15years.) does not qualify.     Additional Screening:  Hepatitis C  Screening: Completed 12/31/2020  Vision Screening: Recommended annual ophthalmology exams for early detection of glaucoma and other disorders of the eye. Is the patient up to date with their annual eye exam?  Yes  Who is the provider or what is the name of the office in which the patient attends annual eye exams? DMalcomScreening: Recommended annual dental exams for proper oral hygiene  Community Resource Referral / Chronic Care Management: CRR required this visit?  No   CCM required this visit?  No      Plan:     I have personally reviewed and noted the following in the patient's chart:   Medical and social history Use of alcohol, tobacco or illicit drugs  Current medications and supplements including opioid prescriptions. Patient is not currently taking opioid prescriptions. Functional ability and status Nutritional status Physical activity Advanced directives List of other physicians Hospitalizations, surgeries, and ER visits in previous 12 months Vitals Screenings to include cognitive, depression, and falls Referrals and appointments  In addition, I have reviewed and discussed with patient certain preventive protocols, quality metrics, and best practice recommendations. A written personalized care plan for preventive services as well as general preventive health recommendations were provided to patient.   Due to this being a telephonic visit, the after visit summary with patients personalized plan was offered to patient via mail or my-chart.  Patient would like to access on my-chart.   MMarta Antu LPN   [id number]/93/[id number] Nurse health Advisor  Nurse Notes: None

## 2022-01-31 ENCOUNTER — Other Ambulatory Visit (HOSPITAL_BASED_OUTPATIENT_CLINIC_OR_DEPARTMENT_OTHER): Payer: Self-pay | Admitting: Family Medicine

## 2022-01-31 DIAGNOSIS — Z1231 Encounter for screening mammogram for malignant neoplasm of breast: Secondary | ICD-10-CM

## 2022-02-05 ENCOUNTER — Encounter (HOSPITAL_BASED_OUTPATIENT_CLINIC_OR_DEPARTMENT_OTHER): Payer: Self-pay

## 2022-02-05 ENCOUNTER — Ambulatory Visit (HOSPITAL_BASED_OUTPATIENT_CLINIC_OR_DEPARTMENT_OTHER)
Admission: RE | Admit: 2022-02-05 | Discharge: 2022-02-05 | Disposition: A | Discharge status: Home / Self Care | Payer: Medicare Other | Source: Ambulatory Visit | Attending: Family Medicine | Admitting: Family Medicine

## 2022-02-05 DIAGNOSIS — Z1231 Encounter for screening mammogram for malignant neoplasm of breast: Secondary | ICD-10-CM | POA: Diagnosis not present

## 2022-03-19 ENCOUNTER — Ambulatory Visit: Payer: Medicare Other | Attending: Cardiology | Admitting: Cardiology

## 2022-03-19 ENCOUNTER — Encounter: Payer: Self-pay | Admitting: Cardiology

## 2022-03-19 VITALS — BP 140/70 | HR 65 | Ht 61.0 in | Wt 151.0 lb

## 2022-03-19 DIAGNOSIS — E119 Type 2 diabetes mellitus without complications: Secondary | ICD-10-CM

## 2022-03-19 DIAGNOSIS — I2510 Atherosclerotic heart disease of native coronary artery without angina pectoris: Secondary | ICD-10-CM

## 2022-03-19 DIAGNOSIS — E782 Mixed hyperlipidemia: Secondary | ICD-10-CM | POA: Diagnosis not present

## 2022-03-19 DIAGNOSIS — I10 Essential (primary) hypertension: Secondary | ICD-10-CM

## 2022-03-19 NOTE — Patient Instructions (Signed)
Medication Instructions:  Your physician recommends that you continue on your current medications as directed. Please refer to the Current Medication list given to you today.  *If you need a refill on your cardiac medications before your next appointment, please call your pharmacy*   Lab Work: 2nd [place] Direct LDL- today If you have labs (blood work) drawn today and your tests are completely normal, you will receive your results only by: MyChart Message (if you have MyChart) OR A paper copy in the mail If you have any lab test that is abnormal or we need to change your treatment, we will call you to review the results.   Testing/Procedures: None Ordered   Follow-Up: At [facility], you and your health needs are our priority.  As part of our continuing mission to provide you with exceptional heart care, we have created designated Provider Care Teams.  These Care Teams include your primary Cardiologist (physician) and Advanced Practice Providers (APPs -  Physician Assistants and Nurse Practitioners) who all work together to provide you with the care you need, when you need it.  We recommend signing up for the patient portal called "MyChart".  Sign up information is provided on this After Visit Summary.  MyChart is used to connect with patients for Virtual Visits (Telemedicine).  Patients are able to view lab/test results, encounter notes, upcoming appointments, etc.  Non-urgent messages can be sent to your provider as well.   To learn more about what you can do with MyChart, go to [email or web address].    Your next appointment:   12 month(s)  The format for your next appointment:   In Person  Provider:   Jenne Campus, MD    Other Instructions NA

## 2022-03-19 NOTE — Progress Notes (Signed)
Cardiology Office Note:    Date:  03/19/2022   ID:  Tiffany Powers, DOB [birth year], MRN [id number]  PCP:  Darreld Mclean, MD  Cardiologist:  Jenne Campus, MD    Referring MD: Darreld Mclean, MD   Chief Complaint  Patient presents with   Follow-up  Doing very well  History of Present Illness:    Tiffany Powers is a 66 y.o. female with past medical history significant for coronary artery disease.  In 2017 she did have small myocardial infarction cardiac catheterization done showed 99% stenosis very small intermediate branch as well as 30% stenosis of proximal LAD, no intervention has been performed.  Additional problems include essential hypertension, dyslipidemia, diabetes.  She is coming today 2 months for follow-up overall doing very well.  She walks on the regular basis 5-6 times a week for about 1 hour she was brisk to the point that she cannot talk on the phone when she does it.  Overall doing very well  Past Medical History:  Diagnosis Date   Diabetes mellitus without complication ([place])    High cholesterol    Hypertension    MI (myocardial infarction) ([place]) 2001   NSTEMI (non-ST elevated myocardial infarction) ([place]) 12/07/2015    Past Surgical History:  Procedure Laterality Date   CARDIAC CATHETERIZATION  2001; 2002; 12/06/2105   CARDIAC CATHETERIZATION N/A 12/07/2015   Procedure: Left Heart Cath and Coronary Angiography;  Surgeon: [name] Prows, MD;  Location: [facility];  Service: Cardiovascular;  Laterality: N/A;   TUBAL LIGATION  1980s    Current Medications: Current Meds  Medication Sig   ALPRAZolam (XANAX) 0.25 MG tablet Take 1 tablet (0.25 mg total) by mouth at bedtime. Use as needed for sleep   clopidogrel (PLAVIX) 75 MG tablet Take 1 tablet (75 mg total) by mouth daily.   Cyanocobalamin (VITAMIN B-12 PO) Take 1 tablet by mouth daily.   ezetimibe (ZETIA) 10 MG tablet Take 1 tablet (10 mg total) by mouth daily.   hydrochlorothiazide (HYDRODIURIL) 25  MG tablet Take 1 tablet (25 mg total) by mouth daily.   nitroGLYCERIN (NITROSTAT) 0.4 MG SL tablet Place 1 tablet (0.4 mg total) under the tongue every 5 (five) minutes as needed for chest pain.   rosuvastatin (CRESTOR) 40 MG tablet Take 1 tablet (40 mg total) by mouth daily.   telmisartan (MICARDIS) 40 MG tablet Take 1 tablet (40 mg total) by mouth daily.     Allergies:   Patient has no known allergies.   Social History   Socioeconomic History   Marital status: Divorced    Spouse name: Not on file   Number of children: Not on file   Years of education: Not on file   Highest education level: Not on file  Occupational History   Not on file  Tobacco Use   Smoking status: Never    Passive exposure: Never   Smokeless tobacco: Never  Vaping Use   Vaping Use: Never used  Substance and Sexual Activity   Alcohol use: Yes    Alcohol/week: 1.0 standard drink of alcohol    Types: 1 Glasses of wine per week    Comment: occasionally   Drug use: No   Sexual activity: Not Currently    Birth control/protection: Post-menopausal  Other Topics Concern   Not on file  Social History Narrative   Not on file   Social Determinants of Health   Financial Resource Strain: Low Risk  (12/02/2021)   Overall Financial Resource Strain (  CARDIA)    Difficulty of Paying Living Expenses: Not hard at all  Food Insecurity: No Food Insecurity (12/02/2021)   Hunger Vital Sign    Worried About Running Out of Food in the Last Year: Never true    Ran Out of Food in the Last Year: Never true  Transportation Needs: No Transportation Needs (12/02/2021)   PRAPARE - Hydrologist (Medical): No    Lack of Transportation (Non-Medical): No  Physical Activity: Sufficiently Active (12/02/2021)   Exercise Vital Sign    Days of Exercise per Week: 6 days    Minutes of Exercise per Session: 40 min  Stress: No Stress Concern Present (12/02/2021)   [place]    Feeling of Stress : Not at all  Social Connections: Moderately Integrated (12/02/2021)   Social Connection and Isolation Panel [NHANES]    Frequency of Communication with Friends and Family: More than three times a week    Frequency of Social Gatherings with Friends and Family: More than three times a week    Attends Religious Services: More than 4 times per year    Active Member of [employer] or Organizations: Yes    Attends Music therapist: More than 4 times per year    Marital Status: Divorced     Family History: The patient's family history includes CAD (age of onset: 62) in her brother; CAD (age of onset: 59) in her father; CAD (age of onset: 25) in her maternal grandmother. ROS:   Please see the history of present illness.    All 14 point review of systems negative except as described per history of present illness  EKGs/Labs/Other Studies Reviewed:      Recent Labs: 06/06/2021: ALT 19; BUN 18; Creatinine, Ser 0.77; Hemoglobin 12.3; Platelets 244.0; Potassium 4.2; Sodium 141; TSH 2.36  Recent Lipid Panel    Component Value Date/Time   CHOL 163 06/06/2021 1057   TRIG 81.0 06/06/2021 1057   HDL 45.20 06/06/2021 1057   CHOLHDL 4 06/06/2021 1057   VLDL 16.2 06/06/2021 1057   LDLCALC 101 (H) 06/06/2021 1057    Physical Exam:    VS:  BP (!) 140/70 (BP Location: Left Arm, Patient Position: Sitting)   Pulse 65   Ht '5\' 1"'$  (1.549 m)   Wt 151 lb (68.5 kg)   SpO2 95%   BMI 28.53 kg/m     Wt Readings from Last 3 Encounters:  03/19/22 151 lb (68.5 kg)  12/02/21 148 lb (67.1 kg)  09/10/21 148 lb (67.1 kg)     GEN:  Well nourished, well developed in no acute distress HEENT: Normal NECK: No JVD; No carotid bruits LYMPHATICS: No lymphadenopathy CARDIAC: RRR, no murmurs, no rubs, no gallops RESPIRATORY:  Clear to auscultation without rales, wheezing or rhonchi  ABDOMEN: Soft, non-tender, non-distended MUSCULOSKELETAL:  No edema;  No deformity  SKIN: Warm and dry LOWER EXTREMITIES: no swelling NEUROLOGIC:  Alert and oriented x 3 PSYCHIATRIC:  Normal affect   ASSESSMENT:    1. Coronary artery disease involving native coronary artery of native heart without angina pectoris   2. Controlled type 2 diabetes mellitus without complication, without long-term current use of insulin ([place])   3. Mixed hyperlipidemia   4. Primary hypertension    PLAN:    In order of problems listed above:  Coronary disease stable from that point review continue antiplatelets therapy as well as statin.  Denies have any symptomatology  that would suggest reactivation of the problem Diabetes that being followed by antimedicine team, last hemoglobin A1c I see from February which is 6.5 Dyslipidemia she is on Crestor 40 as well as Zetia 10.  Last blood test I see is from [facility] LDL 101 HDL 45 this is from February 2023 we will make arrangements for direct LDL to be done. Essential hypertension will be elevated in the office but she said it is good at home continue monitoring. I encouraged her to exercise on the regular basis be on good diet.   Medication Adjustments/Labs and Tests Ordered: Current medicines are reviewed at length with the patient today.  Concerns regarding medicines are outlined above.  No orders of the defined types were placed in this encounter.  Medication changes: No orders of the defined types were placed in this encounter.   Signed, Park Liter, MD, [facility] 03/19/2022 11:13 AM    [place]

## 2022-03-19 NOTE — Addendum Note (Signed)
Addended by: Jacobo Forest D on: 03/19/2022 11:21 AM   Modules accepted: Orders

## 2022-03-20 LAB — LDL CHOLESTEROL, DIRECT: LDL Direct: 65 mg/dL (ref 0–99)

## 2022-03-27 ENCOUNTER — Telehealth: Payer: Self-pay

## 2022-03-27 NOTE — Telephone Encounter (Signed)
-----   Message from Park Liter, MD sent at 03/26/2022 12:43 PM EST ----- Cholesterol looks good, continue present management

## 2022-03-27 NOTE — Telephone Encounter (Signed)
Patient notified through my chart.

## 2022-06-17 NOTE — Patient Instructions (Signed)
It was great to see you again today, I will be in touch with your labs ASAP Recommend shingles vaccine at your pharmacy if not done already

## 2022-06-17 NOTE — Progress Notes (Addendum)
[facility] [address], [address] [place], [place] [address] [phone]  Date:  06/18/2022   Name:  Tiffany Powers   DOB:  [birth year]   MRN:  [id number]  PCP:  Pearline Cables, MD    Chief Complaint: Annual Exam (Concerns/ questions:  none/Eye exam: Elwyn Reach- 06/2021/Foot exam is due/A1C, Urine Albumin, GFR due)   History of Present Illness:  Tiffany Powers is a 67 y.o. very pleasant female patient who presents with the following:  Patient seen today for physical exam-  history of diabetes, hyperlipidemia, NSTEMI 2017   Most recent visit with myself was in February 2023 At that time she was caring for her mother and sister who are both in poor health Her mother did pass away last summer Her sister is coming to end of life as well- she has severe dementia   She enjoys walking about 1 hour a day-she may even do some jogging.  No chest pain or shortness of breath with significant exercise  Eye exam Shingrix Needs urine micro, A1c, renal filtration Foot exam can be updated  She saw her cardiologist, Dr. Kirtland Bouchard in December: Coronary disease stable from that point review continue antiplatelets therapy as well as statin.  Denies have any symptomatology that would suggest reactivation of the problem Diabetes that being followed by antimedicine team, last hemoglobin A1c I see from February which is 6.5 Dyslipidemia she is on Crestor 40 as well as Zetia 10.  Last blood test I see is from [facility] LDL 101 HDL 45 this is from February 2023 we will make arrangements for direct LDL to be done. Essential hypertension will be elevated in the office but she said it is good at home continue monitoring. I encouraged her to exercise on the regular basis be on good diet.  She has also been seeing Dr. Lanetta Inch with urogynecology for vaginal prolapse- they tried a pessary but she did not like it Patient notes prolapse is causing her significant  discomfort.  She may revisit the idea of surgery with Dr. Florian Buff  Mammogram, DEXA scan up-to-date. Pap completed 2020- she does not have to continue at this point  Colonoscopy 2023  Alprazolam as needed Plavix Zetia 10 HCTZ 25 Crestor 40 Telmisartan 40  Lab Results  Component Value Date   HGBA1C 6.5 06/06/2021    Patient Active Problem List   Diagnosis Date Noted   Coronary artery disease cardiac catheterization 2017 showing 99% stenosis of small intermediate branch, up to 30% proximal LAD 07/01/2021   Carpal tunnel syndrome of right wrist 10/19/2019   Controlled type 2 diabetes mellitus without complication, without long-term current use of insulin (HCC) 12/31/2017   Pure hypercholesterolemia 02/19/2017   Hyperlipidemia 07/30/2016   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   NSTEMI (non-ST elevated myocardial infarction) (HCC) 12/07/2015   Hypertension 2001    Past Medical History:  Diagnosis Date   Diabetes mellitus without complication (HCC)    High cholesterol    Hypertension    MI (myocardial infarction) (HCC) 2001   NSTEMI (non-ST elevated myocardial infarction) (HCC) 12/07/2015    Past Surgical History:  Procedure Laterality Date   CARDIAC CATHETERIZATION  2001; 2002; 12/06/2105   CARDIAC CATHETERIZATION N/A 12/07/2015   Procedure: Left Heart Cath and Coronary Angiography;  Surgeon: Yates Decamp, MD;  Location: [facility] INVASIVE [facility] LAB;  Service: Cardiovascular;  Laterality: N/A;   TUBAL LIGATION  1980s    Social History  Tobacco Use   Smoking status: Never    Passive exposure: Never   Smokeless tobacco: Never  Vaping Use   Vaping Use: Never used  Substance Use Topics   Alcohol use: Yes    Alcohol/week: 1.0 standard drink of alcohol    Types: 1 Glasses of wine per week    Comment: occasionally   Drug use: No    Family History  Problem Relation Age of Onset   CAD Father 60       MI   CAD Brother 41       MI   CAD Maternal Grandmother 19       CABG    No  Known Allergies  Medication list has been reviewed and updated.  Current Outpatient Medications on File Prior to Visit  Medication Sig Dispense Refill   ALPRAZolam (XANAX) 0.25 MG tablet Take 1 tablet (0.25 mg total) by mouth at bedtime. Use as needed for sleep 30 tablet 1   clopidogrel (PLAVIX) 75 MG tablet Take 1 tablet (75 mg total) by mouth daily. 90 tablet 3   Cyanocobalamin (VITAMIN B-12 PO) Take 1 tablet by mouth daily.     ezetimibe (ZETIA) 10 MG tablet Take 1 tablet (10 mg total) by mouth daily. 90 tablet 3   hydrochlorothiazide (HYDRODIURIL) 25 MG tablet Take 1 tablet (25 mg total) by mouth daily. 90 tablet 3   nitroGLYCERIN (NITROSTAT) 0.4 MG SL tablet Place 1 tablet (0.4 mg total) under the tongue every 5 (five) minutes as needed for chest pain. 25 tablet 1   rosuvastatin (CRESTOR) 40 MG tablet Take 1 tablet (40 mg total) by mouth daily. 90 tablet 3   telmisartan (MICARDIS) 40 MG tablet Take 1 tablet (40 mg total) by mouth daily. 90 tablet 3   No current facility-administered medications on file prior to visit.    Review of Systems:  As per HPI- otherwise negative.   Physical Examination: Vitals:   06/18/22 1351  BP: 120/64  Pulse: 70  Resp: 18  Temp: 97.8 F (36.6 C)  SpO2: 98%   Vitals:   06/18/22 1351  Weight: 147 lb (66.7 kg)  Height: 5\' 1"  (1.549 m)   Body mass index is 27.78 kg/m. Ideal Body Weight: Weight in (lb) to have BMI = 25: 132  GEN: no acute distress. Mild overweight, looks well  HEENT: Atraumatic, Normocephalic.  Bilateral TM wnl, oropharynx normal.  PEERL,EOMI.   Ears and Nose: No external deformity. CV: RRR, No M/G/R. No JVD. No thrill. No extra heart sounds. PULM: CTA B, no wheezes, crackles, rhonchi. No retractions. No resp. distress. No accessory muscle use. ABD: S, NT, ND, +BS. No rebound. No HSM. EXTR: No c/c/e PSYCH: Normally interactive. Conversant.  Foot exam normal   Assessment and Plan: History of MI (myocardial infarction)  - Plan: clopidogrel (PLAVIX) 75 MG tablet, hydrochlorothiazide (HYDRODIURIL) 25 MG tablet, nitroGLYCERIN (NITROSTAT) 0.4 MG SL tablet, telmisartan (MICARDIS) 40 MG tablet  Hyperlipidemia, unspecified hyperlipidemia type - Plan: Lipid panel, ezetimibe (ZETIA) 10 MG tablet, rosuvastatin (CRESTOR) 40 MG tablet  Screening for deficiency anemia - Plan: CBC  Controlled type 2 diabetes mellitus without complication, without long-term current use of insulin (HCC) - Plan: Comprehensive metabolic panel, Hemoglobin A1c, Microalbumin / creatinine urine ratio  Screening for thyroid disorder - Plan: TSH  Physical exam  Adjustment disorder with mixed anxiety and depressed mood - Plan: ALPRAZolam (XANAX) 0.25 MG tablet  B12 deficiency - Plan: B12  Physical exam today.  Encouraged healthy diet  and exercise routine Will plan further follow- up pending labs. Refilled medications as above She does not use her nitro, but does need a new bottle as hers is expired  Signed Abbe Amsterdam, MD  Received labs as below, message to pt  Results for orders placed or performed in visit on 06/18/22  CBC  Result Value Ref Range   WBC 7.3 4.0 - 10.5 K/uL   RBC 4.38 3.87 - 5.11 Mil/uL   Platelets 266.0 150.0 - 400.0 K/uL   Hemoglobin 13.0 12.0 - 15.0 g/dL   HCT 56.2 13.0 - 86.5 %   MCV 87.8 78.0 - 100.0 fl   MCHC 33.9 30.0 - 36.0 g/dL   RDW 78.4 69.6 - 29.5 %  Comprehensive metabolic panel  Result Value Ref Range   Sodium 139 135 - 145 mEq/L   Potassium 3.9 3.5 - 5.1 mEq/L   Chloride 101 96 - 112 mEq/L   CO2 29 19 - 32 mEq/L   Glucose, Bld 98 70 - 99 mg/dL   BUN 17 6 - 23 mg/dL   Creatinine, Ser 2.84 0.40 - 1.20 mg/dL   Total Bilirubin 0.6 0.2 - 1.2 mg/dL   Alkaline Phosphatase 69 39 - 117 U/L   AST 28 0 - 37 U/L   ALT 27 0 - 35 U/L   Total Protein 7.0 6.0 - 8.3 g/dL   Albumin 4.3 3.5 - 5.2 g/dL   GFR 13.24 >40.10 mL/min   Calcium 10.1 8.4 - 10.5 mg/dL  Hemoglobin U7O  Result Value Ref Range    Hgb A1c MFr Bld 6.8 (H) 4.6 - 6.5 %  Lipid panel  Result Value Ref Range   Cholesterol 140 0 - 200 mg/dL   Triglycerides 53.6 0.0 - 149.0 mg/dL   HDL 64.40 >34.74 mg/dL   VLDL 25.9 0.0 - 56.3 mg/dL   LDL Cholesterol 84 0 - 99 mg/dL   Total CHOL/HDL Ratio 3    NonHDL 99.03   TSH  Result Value Ref Range   TSH 2.68 0.35 - 5.50 uIU/mL  Microalbumin / creatinine urine ratio  Result Value Ref Range   Microalb, Ur <0.7 0.0 - 1.9 mg/dL   Creatinine,U 875.6 mg/dL   Microalb Creat Ratio 0.6 0.0 - 30.0 mg/g  B12  Result Value Ref Range   Vitamin B-12 583 211 - 911 pg/mL

## 2022-06-18 ENCOUNTER — Ambulatory Visit (INDEPENDENT_AMBULATORY_CARE_PROVIDER_SITE_OTHER): Payer: Medicare Other | Admitting: Family Medicine

## 2022-06-18 VITALS — BP 120/64 | HR 70 | Temp 97.8°F | Resp 18 | Ht 61.0 in | Wt 147.0 lb

## 2022-06-18 DIAGNOSIS — Z0000 Encounter for general adult medical examination without abnormal findings: Secondary | ICD-10-CM

## 2022-06-18 DIAGNOSIS — E538 Deficiency of other specified B group vitamins: Secondary | ICD-10-CM | POA: Diagnosis not present

## 2022-06-18 DIAGNOSIS — Z130 Encounter for screening for diseases of the blood and blood-forming organs and certain disorders involving the immune mechanism: Secondary | ICD-10-CM

## 2022-06-18 DIAGNOSIS — E119 Type 2 diabetes mellitus without complications: Secondary | ICD-10-CM | POA: Diagnosis not present

## 2022-06-18 DIAGNOSIS — Z1329 Encounter for screening for other suspected endocrine disorder: Secondary | ICD-10-CM | POA: Diagnosis not present

## 2022-06-18 DIAGNOSIS — I252 Old myocardial infarction: Secondary | ICD-10-CM | POA: Diagnosis not present

## 2022-06-18 DIAGNOSIS — E785 Hyperlipidemia, unspecified: Secondary | ICD-10-CM | POA: Diagnosis not present

## 2022-06-18 DIAGNOSIS — F4323 Adjustment disorder with mixed anxiety and depressed mood: Secondary | ICD-10-CM

## 2022-06-18 MED ORDER — ALPRAZOLAM 0.25 MG PO TABS: 0.2500 mg | ORAL_TABLET | Freq: Every day | ORAL | 1 refills | Status: DC

## 2022-06-18 MED ORDER — NITROGLYCERIN 0.4 MG SL SUBL: 0.4000 mg | SUBLINGUAL_TABLET | SUBLINGUAL | 1 refills | Status: AC | PRN

## 2022-06-18 MED ORDER — EZETIMIBE 10 MG PO TABS: 10.0000 mg | ORAL_TABLET | Freq: Every day | ORAL | 3 refills | Status: DC

## 2022-06-18 MED ORDER — TELMISARTAN 40 MG PO TABS: 40.0000 mg | ORAL_TABLET | Freq: Every day | ORAL | 3 refills | Status: DC

## 2022-06-18 MED ORDER — ROSUVASTATIN CALCIUM 40 MG PO TABS: 40.0000 mg | ORAL_TABLET | Freq: Every day | ORAL | 3 refills | Status: DC

## 2022-06-18 MED ORDER — CLOPIDOGREL BISULFATE 75 MG PO TABS: 75.0000 mg | ORAL_TABLET | Freq: Every day | ORAL | 3 refills | Status: DC

## 2022-06-18 MED ORDER — HYDROCHLOROTHIAZIDE 25 MG PO TABS: 25.0000 mg | ORAL_TABLET | Freq: Every day | ORAL | 3 refills | Status: DC

## 2022-06-19 ENCOUNTER — Encounter: Payer: Self-pay | Admitting: Family Medicine

## 2022-06-19 LAB — COMPREHENSIVE METABOLIC PANEL
ALT: 27 U/L (ref 0–35)
AST: 28 U/L (ref 0–37)
Albumin: 4.3 g/dL (ref 3.5–5.2)
Alkaline Phosphatase: 69 U/L (ref 39–117)
BUN: 17 mg/dL (ref 6–23)
CO2: 29 mEq/L (ref 19–32)
Calcium: 10.1 mg/dL (ref 8.4–10.5)
Chloride: 101 mEq/L (ref 96–112)
Creatinine, Ser: 0.76 mg/dL (ref 0.40–1.20)
GFR: 80.73 mL/min (ref >60.00)
Glucose, Bld: 98 mg/dL (ref 70–99)
Potassium: 3.9 mEq/L (ref 3.5–5.1)
Sodium: 139 mEq/L (ref 135–145)
Total Bilirubin: 0.6 mg/dL (ref 0.2–1.2)
Total Protein: 7 g/dL (ref 6.0–8.3)

## 2022-06-19 LAB — CBC
HCT: 38.4 % (ref 36.0–46.0)
Hemoglobin: 13 g/dL (ref 12.0–15.0)
MCHC: 33.9 g/dL (ref 30.0–36.0)
MCV: 87.8 fl (ref 78.0–100.0)
Platelets: 266 10*3/uL (ref 150.0–400.0)
RBC: 4.38 Mil/uL (ref 3.87–5.11)
RDW: 14 % (ref 11.5–15.5)
WBC: 7.3 10*3/uL (ref 4.0–10.5)

## 2022-06-19 LAB — LIPID PANEL
Cholesterol: 140 mg/dL (ref 0–200)
HDL: 41.4 mg/dL (ref >39.00)
LDL Cholesterol: 84 mg/dL (ref 0–99)
NonHDL: 99.03
Total CHOL/HDL Ratio: 3
Triglycerides: 75 mg/dL (ref 0.0–149.0)
VLDL: 15 mg/dL (ref 0.0–40.0)

## 2022-06-19 LAB — MICROALBUMIN / CREATININE URINE RATIO
Creatinine,U: 125.9 mg/dL
Microalb Creat Ratio: 0.6 mg/g (ref 0.0–30.0)
Microalb, Ur: <0.7 mg/dL (ref 0.0–1.9)

## 2022-06-19 LAB — HEMOGLOBIN A1C: Hgb A1c MFr Bld: 6.8 % — ABNORMAL HIGH (ref 4.6–6.5)

## 2022-06-19 LAB — TSH: TSH: 2.68 u[IU]/mL (ref 0.35–5.50)

## 2022-06-19 LAB — VITAMIN B12: Vitamin B-12: 583 pg/mL (ref 211–911)

## 2022-07-08 DIAGNOSIS — H04123 Dry eye syndrome of bilateral lacrimal glands: Secondary | ICD-10-CM | POA: Diagnosis not present

## 2022-07-08 DIAGNOSIS — H25813 Combined forms of age-related cataract, bilateral: Secondary | ICD-10-CM | POA: Diagnosis not present

## 2022-07-08 DIAGNOSIS — H524 Presbyopia: Secondary | ICD-10-CM | POA: Diagnosis not present

## 2022-07-25 ENCOUNTER — Telehealth: Payer: Medicare Other | Admitting: Physician Assistant

## 2022-07-25 DIAGNOSIS — U071 COVID-19: Secondary | ICD-10-CM

## 2022-07-25 DIAGNOSIS — R051 Acute cough: Secondary | ICD-10-CM

## 2022-07-25 MED ORDER — ONDANSETRON HCL 4 MG PO TABS: 4.0000 mg | ORAL_TABLET | Freq: Three times a day (TID) | ORAL | 0 refills | Status: DC | PRN

## 2022-07-25 MED ORDER — BENZONATATE 100 MG PO CAPS
100.0000 mg | ORAL_CAPSULE | Freq: Three times a day (TID) | ORAL | 0 refills | Status: DC | PRN
Start: 2022-07-25 — End: 2022-12-09

## 2022-07-25 MED ORDER — MOLNUPIRAVIR EUA 200MG CAPSULE
4.0000 | ORAL_CAPSULE | Freq: Two times a day (BID) | ORAL | 0 refills | Status: AC
Start: 2022-07-25 — End: 2022-07-30

## 2022-07-25 NOTE — Patient Instructions (Signed)
[name], thank you for joining Tylene Fantasia Ward, PA-C for today's virtual visit.  While this provider is not your primary care provider (PCP), if your PCP is located in our provider database this encounter information will be shared with them immediately following your visit.   A [facility] MyChart account gives you access to today's visit and all your visits, tests, and labs performed at [facility] " click here if you don't have a [facility] MyChart account or go to [email or web address]  Consent: (Patient) Tiffany Powers provided verbal consent for this virtual visit at the beginning of the encounter.  Current Medications:  Current Outpatient Medications:    benzonatate (TESSALON) 100 MG capsule, Take 1 capsule (100 mg total) by mouth 3 (three) times daily as needed., Disp: 20 capsule, Rfl: 0   molnupiravir EUA (LAGEVRIO) 200 mg CAPS capsule, Take 4 capsules (800 mg total) by mouth 2 (two) times daily for 5 days., Disp: 40 capsule, Rfl: 0   ondansetron (ZOFRAN) 4 MG tablet, Take 1 tablet (4 mg total) by mouth every 8 (eight) hours as needed for nausea or vomiting., Disp: 20 tablet, Rfl: 0   ALPRAZolam (XANAX) 0.25 MG tablet, Take 1 tablet (0.25 mg total) by mouth at bedtime. Use as needed for sleep, Disp: 30 tablet, Rfl: 1   clopidogrel (PLAVIX) 75 MG tablet, Take 1 tablet (75 mg total) by mouth daily., Disp: 90 tablet, Rfl: 3   Cyanocobalamin (VITAMIN B-12 PO), Take 1 tablet by mouth daily., Disp: , Rfl:    ezetimibe (ZETIA) 10 MG tablet, Take 1 tablet (10 mg total) by mouth daily., Disp: 90 tablet, Rfl: 3   hydrochlorothiazide (HYDRODIURIL) 25 MG tablet, Take 1 tablet (25 mg total) by mouth daily., Disp: 90 tablet, Rfl: 3   nitroGLYCERIN (NITROSTAT) 0.4 MG SL tablet, Place 1 tablet (0.4 mg total) under the tongue every 5 (five) minutes as needed for chest pain., Disp: 25 tablet, Rfl: 1   rosuvastatin (CRESTOR) 40 MG tablet, Take 1 tablet (40 mg total) by mouth daily.,  Disp: 90 tablet, Rfl: 3   telmisartan (MICARDIS) 40 MG tablet, Take 1 tablet (40 mg total) by mouth daily., Disp: 90 tablet, Rfl: 3   Medications ordered in this encounter:  Meds ordered this encounter  Medications   molnupiravir EUA (LAGEVRIO) 200 mg CAPS capsule    Sig: Take 4 capsules (800 mg total) by mouth 2 (two) times daily for 5 days.    Dispense:  40 capsule    Refill:  0    Order Specific Question:   Supervising Provider    Answer:   Merrilee Jansky [[id number]]   benzonatate (TESSALON) 100 MG capsule    Sig: Take 1 capsule (100 mg total) by mouth 3 (three) times daily as needed.    Dispense:  20 capsule    Refill:  0    Order Specific Question:   Supervising Provider    Answer:   LAMPTEY, PHILIP O [[id number]]   ondansetron (ZOFRAN) 4 MG tablet    Sig: Take 1 tablet (4 mg total) by mouth every 8 (eight) hours as needed for nausea or vomiting.    Dispense:  20 tablet    Refill:  0    Order Specific Question:   Supervising Provider    Answer:   Merrilee Jansky [id number]     *If you need refills on other medications prior to your next appointment, please contact your pharmacy*  Follow-Up: Call back or seek an in-person  evaluation if the symptoms worsen or if the condition fails to improve as anticipated.  [facility] [phone]  Other Instructions Take Molnupiravir as prescribed.  Can take Tessalon as needed for cough.  Can take Zofran as needed for nausea.  Recommend Mucinex and Flonase.  Follow up with PCP or Urgent Care if you develop new or worsening symptoms.  Drink plenty of fluids, rest.    If you have been instructed to have an in-person evaluation today at a local Urgent Care facility, please use the link below. It will take you to a list of all of our available [facility], including address, phone number and hours of operation. Please do not delay care.  [facility]  If you or a family member do not have a primary  care provider, use the link below to schedule a visit and establish care. When you choose a [facility] primary care physician or advanced practice provider, you gain a long-term partner in health. Find a Primary Care Provider  Learn more about [place]'s in-office and virtual care options: [facility] - Get Care Now

## 2022-07-25 NOTE — Progress Notes (Signed)
Virtual Visit Consent   Tiffany Powers, you are scheduled for a virtual visit with a [facility] provider today. Just as with appointments in the office, your consent must be obtained to participate. Your consent will be active for this visit and any virtual visit you may have with one of our providers in the next 365 days. If you have a MyChart account, a copy of this consent can be sent to you electronically.  As this is a virtual visit, video technology does not allow for your provider to perform a traditional examination. This may limit your provider's ability to fully assess your condition. If your provider identifies any concerns that need to be evaluated in person or the need to arrange testing (such as labs, EKG, etc.), we will make arrangements to do so. Although advances in technology are sophisticated, we cannot ensure that it will always work on either your end or our end. If the connection with a video visit is poor, the visit may have to be switched to a telephone visit. With either a video or telephone visit, we are not always able to ensure that we have a secure connection.  By engaging in this virtual visit, you consent to the provision of healthcare and authorize for your insurance to be billed (if applicable) for the services provided during this visit. Depending on your insurance coverage, you may receive a charge related to this service.  I need to obtain your verbal consent now. Are you willing to proceed with your visit today? Tiffany Powers has provided verbal consent on 07/25/2022 for a virtual visit (video or telephone). Tiffany Fantasia Ward, PA-C  Date: 07/25/2022 4:05 PM  Virtual Visit via Video Note   I, Tiffany Powers, connected with  Tiffany Powers  ([id number], December 01, 1955) on 07/25/22 at  4:00 PM EDT by a video-enabled telemedicine application and verified that I am speaking with the correct person using two identifiers.  Location: Patient: Virtual Visit Location Patient:  Home Provider: Virtual Visit Location Provider: Home   I discussed the limitations of evaluation and management by telemedicine and the availability of in person appointments. The patient expressed understanding and agreed to proceed.    History of Present Illness: Tiffany Powers is a 68 y.o. who identifies as a female who was assigned female at birth, and is being seen today for COVID.  Reports Monday she started with some mild sx, HA, and cold chills.  She started taking cold and cough.  Tuesday tested positive for COVID on home test. She is coughing, cough is worse at night.  She denies wheezing, shortness of breath.  She is also complains nausea and diarrhea.   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   NSTEMI (non-ST elevated myocardial infarction) 12/07/2015   Hypertension 2001    Allergies: No Known Allergies Medications:  Current Outpatient Medications:    benzonatate (TESSALON) 100 MG capsule, Take 1 capsule (100 mg total) by mouth 3 (three) times daily as needed., Disp: 20 capsule, Rfl: 0   molnupiravir EUA (LAGEVRIO) 200 mg CAPS capsule, Take 4 capsules (800 mg total) by mouth  2 (two) times daily for 5 days., Disp: 40 capsule, Rfl: 0   ondansetron (ZOFRAN) 4 MG tablet, Take 1 tablet (4 mg total) by mouth every 8 (eight) hours as needed for nausea or vomiting., Disp: 20 tablet, Rfl: 0   ALPRAZolam (XANAX) 0.25 MG tablet, Take 1 tablet (0.25 mg total) by mouth at bedtime. Use as needed for sleep, Disp: 30 tablet, Rfl: 1   clopidogrel (PLAVIX) 75 MG  tablet, Take 1 tablet (75 mg total) by mouth daily., Disp: 90 tablet, Rfl: 3   Cyanocobalamin (VITAMIN B-12 PO), Take 1 tablet by mouth daily., Disp: , Rfl:    ezetimibe (ZETIA) 10 MG tablet, Take 1 tablet (10 mg total) by mouth daily., Disp: 90 tablet, Rfl: 3   hydrochlorothiazide (HYDRODIURIL) 25 MG tablet, Take 1 tablet (25 mg total) by mouth daily., Disp: 90 tablet, Rfl: 3   nitroGLYCERIN (NITROSTAT) 0.4 MG SL tablet, Place 1 tablet (0.4 mg total) under the tongue every 5 (five) minutes as needed for chest pain., Disp: 25 tablet, Rfl: 1   rosuvastatin (CRESTOR) 40 MG tablet, Take 1 tablet (40 mg total) by mouth daily., Disp: 90 tablet, Rfl: 3   telmisartan (MICARDIS) 40 MG tablet, Take 1 tablet (40 mg total) by mouth daily., Disp: 90 tablet, Rfl: 3  Observations/Objective: Patient is well-developed, well-nourished in no acute distress.  Resting comfortably at home.  Head is normocephalic, atraumatic.  No labored breathing.  Speech is clear and coherent with logical content.  Patient is alert and oriented at baseline.    Assessment and Plan: 1. COVID-19 - molnupiravir EUA (LAGEVRIO) 200 mg CAPS capsule; Take 4 capsules (800 mg total) by mouth 2 (two) times daily for 5 days.  Dispense: 40 capsule; Refill: 0 - benzonatate (TESSALON) 100 MG capsule; Take 1 capsule (100 mg total) by mouth 3 (three) times daily as needed.  Dispense: 20 capsule; Refill: 0 - ondansetron (ZOFRAN) 4 MG tablet; Take 1 tablet (4 mg total) by mouth every 8 (eight) hours as needed for nausea or vomiting.  Dispense: 20 tablet; Refill: 0  2. Acute cough - benzonatate (TESSALON) 100 MG capsule; Take 1 capsule (100 mg total) by mouth 3 (three) times daily as needed.  Dispense: 20 capsule; Refill: 0  Supportive care discussed.  In person evaluation precautions discussed.   Follow Up Instructions: I discussed the assessment and treatment plan with the patient. The patient was provided an opportunity to ask questions and  all were answered. The patient agreed with the plan and demonstrated an understanding of the instructions.  A copy of instructions were sent to the patient via MyChart unless otherwise noted below.     The patient was advised to call back or seek an in-person evaluation if the symptoms worsen or if the condition fails to improve as anticipated.  Time:  I spent 7 minutes with the patient via telehealth technology discussing the above problems/concerns.    Tiffany Fantasia Ward, PA-C

## 2022-07-25 NOTE — Progress Notes (Signed)
  Thank you for the details you included in the comment boxes. Those details are very helpful in determining the best course of treatment for you and help [place] to provide the best care. Because you are Covid 19 positive, we recommend that you convert this visit to a video visit in order for the provider to better assess what is going on.  The provider will be able to give you a more accurate diagnosis and treatment plan if we can more freely discuss your symptoms and with the addition of a virtual examination.   If you convert to a video visit, we will bill your insurance (similar to an office visit) and you will not be charged for this e-Visit. You will be able to stay at home and speak with the first available [facility] advanced practice provider. The link to do a video visit is in the drop down Menu tab of your Welcome screen in MyChart.  You will need to schedule the video visit. You can do this through one of two ways:  1) Go into your MyChart App and select the "Menu" button, then select the "Virtual Urgent Care Visit" then proceed scheduling -OR- 2) Go to [email or web address] and select "Get Started" under the Virtual Urgent Care option, select "View all options", then select the "Schedule on your Time" and proceed with scheduling.  I have spent 5 minutes in review of e-visit questionnaire, review and updating patient chart, medical decision making and response to patient.   Margaretann Loveless, PA-C

## 2022-07-28 ENCOUNTER — Encounter: Payer: Self-pay | Admitting: *Deleted

## 2022-12-09 ENCOUNTER — Ambulatory Visit (INDEPENDENT_AMBULATORY_CARE_PROVIDER_SITE_OTHER): Payer: Medicare Other | Admitting: *Deleted

## 2022-12-09 DIAGNOSIS — Z0000 Encounter for general adult medical examination without abnormal findings: Secondary | ICD-10-CM | POA: Diagnosis not present

## 2022-12-09 NOTE — Patient Instructions (Signed)
Ms. Guia , Thank you for taking time to come for your Medicare Wellness Visit. I appreciate your ongoing commitment to your health goals. Please review the following plan we discussed and let me know if I can assist you in the future.   These are the goals we discussed:  Goals      Patient Stated     Continue walking & eating healthy        This is a list of the screening recommended for you and due dates:  Health Maintenance  Topic Date Due   Eye exam for diabetics  Never done   Zoster (Shingles) Vaccine (1 of 2) Never done   COVID-19 Vaccine (5 - 2023-24 season) 05/04/2022   Complete foot exam   06/06/2022   Flu Shot  11/13/2022   Hemoglobin A1C  12/19/2022   Yearly kidney function blood test for diabetes  06/18/2023   Yearly kidney health urinalysis for diabetes  06/18/2023   Medicare Annual Wellness Visit  12/09/2023   Mammogram  02/06/2024   DTaP/Tdap/Td vaccine (3 - Td or Tdap) 12/31/2027   Colon Cancer Screening  10/01/2028   Pneumonia Vaccine  Completed   DEXA scan (bone density measurement)  Completed   Hepatitis C Screening  Completed   HPV Vaccine  Aged Out     Next appointment: Follow up in one year for your annual wellness visit.   Preventive Care 68 Years and Older, Female Preventive care refers to lifestyle choices and visits with your health care provider that can promote health and wellness. What does preventive care include? A yearly physical exam. This is also called an annual well check. Dental exams once or twice a year. Routine eye exams. Ask your health care provider how often you should have your eyes checked. Personal lifestyle choices, including: Daily care of your teeth and gums. Regular physical activity. Eating a healthy diet. Avoiding tobacco and drug use. Limiting alcohol use. Practicing safe sex. Taking low-dose aspirin every day. Taking vitamin and mineral supplements as recommended by your health care provider. What happens  during an annual well check? The services and screenings done by your health care provider during your annual well check will depend on your age, overall health, lifestyle risk factors, and family history of disease. Counseling  Your health care provider may ask you questions about your: Alcohol use. Tobacco use. Drug use. Emotional well-being. Home and relationship well-being. Sexual activity. Eating habits. History of falls. Memory and ability to understand (cognition). Work and work Astronomer. Reproductive health. Screening  You may have the following tests or measurements: Height, weight, and BMI. Blood pressure. Lipid and cholesterol levels. These may be checked every 5 years, or more frequently if you are over 86 years old. Skin check. Lung cancer screening. You may have this screening every year starting at age 27 if you have a 30-pack-year history of smoking and currently smoke or have quit within the past 15 years. Fecal occult blood test (FOBT) of the stool. You may have this test every year starting at age 47. Flexible sigmoidoscopy or colonoscopy. You may have a sigmoidoscopy every 5 years or a colonoscopy every 10 years starting at age 52. Hepatitis C blood test. Hepatitis B blood test. Sexually transmitted disease (STD) testing. Diabetes screening. This is done by checking your blood sugar (glucose) after you have not eaten for a while (fasting). You may have this done every 1-3 years. Bone density scan. This is done to screen for osteoporosis. You  may have this done starting at age 37. Mammogram. This may be done every 1-2 years. Talk to your health care provider about how often you should have regular mammograms. Talk with your health care provider about your test results, treatment options, and if necessary, the need for more tests. Vaccines  Your health care provider may recommend certain vaccines, such as: Influenza vaccine. This is recommended every  year. Tetanus, diphtheria, and acellular pertussis (Tdap, Td) vaccine. You may need a Td booster every 10 years. Zoster vaccine. You may need this after age 47. Pneumococcal 13-valent conjugate (PCV13) vaccine. One dose is recommended after age 25. Pneumococcal polysaccharide (PPSV23) vaccine. One dose is recommended after age 59. Talk to your health care provider about which screenings and vaccines you need and how often you need them. This information is not intended to replace advice given to you by your health care provider. Make sure you discuss any questions you have with your health care provider. Document Released: 04/27/2015 Document Revised: 12/19/2015 Document Reviewed: 01/30/2015 Elsevier Interactive Patient Education  2017 [employer].  Fall Prevention in the Home Falls can cause injuries. They can happen to people of all ages. There are many things you can do to make your home safe and to help prevent falls. What can I do on the outside of my home? Regularly fix the edges of walkways and driveways and fix any cracks. Remove anything that might make you trip as you walk through a door, such as a raised step or threshold. Trim any bushes or trees on the path to your home. Use bright outdoor lighting. Clear any walking paths of anything that might make someone trip, such as rocks or tools. Regularly check to see if handrails are loose or broken. Make sure that both sides of any steps have handrails. Any raised decks and porches should have guardrails on the edges. Have any leaves, snow, or ice cleared regularly. Use sand or salt on walking paths during winter. Clean up any spills in your garage right away. This includes oil or grease spills. What can I do in the bathroom? Use night lights. Install grab bars by the toilet and in the tub and shower. Do not use towel bars as grab bars. Use non-skid mats or decals in the tub or shower. If you need to sit down in the shower, use a  plastic, non-slip stool. Keep the floor dry. Clean up any water that spills on the floor as soon as it happens. Remove soap buildup in the tub or shower regularly. Attach bath mats securely with double-sided non-slip rug tape. Do not have throw rugs and other things on the floor that can make you trip. What can I do in the bedroom? Use night lights. Make sure that you have a light by your bed that is easy to reach. Do not use any sheets or blankets that are too big for your bed. They should not hang down onto the floor. Have a firm chair that has side arms. You can use this for support while you get dressed. Do not have throw rugs and other things on the floor that can make you trip. What can I do in the kitchen? Clean up any spills right away. Avoid walking on wet floors. Keep items that you use a lot in easy-to-reach places. If you need to reach something above you, use a strong step stool that has a grab bar. Keep electrical cords out of the way. Do not use floor  polish or wax that makes floors slippery. If you must use wax, use non-skid floor wax. Do not have throw rugs and other things on the floor that can make you trip. What can I do with my stairs? Do not leave any items on the stairs. Make sure that there are handrails on both sides of the stairs and use them. Fix handrails that are broken or loose. Make sure that handrails are as long as the stairways. Check any carpeting to make sure that it is firmly attached to the stairs. Fix any carpet that is loose or worn. Avoid having throw rugs at the top or bottom of the stairs. If you do have throw rugs, attach them to the floor with carpet tape. Make sure that you have a light switch at the top of the stairs and the bottom of the stairs. If you do not have them, ask someone to add them for you. What else can I do to help prevent falls? Wear shoes that: Do not have high heels. Have rubber bottoms. Are comfortable and fit you  well. Are closed at the toe. Do not wear sandals. If you use a stepladder: Make sure that it is fully opened. Do not climb a closed stepladder. Make sure that both sides of the stepladder are locked into place. Ask someone to hold it for you, if possible. Clearly mark and make sure that you can see: Any grab bars or handrails. First and last steps. Where the edge of each step is. Use tools that help you move around (mobility aids) if they are needed. These include: Canes. Walkers. Scooters. Crutches. Turn on the lights when you go into a dark area. Replace any light bulbs as soon as they burn out. Set up your furniture so you have a clear path. Avoid moving your furniture around. If any of your floors are uneven, fix them. If there are any pets around you, be aware of where they are. Review your medicines with your doctor. Some medicines can make you feel dizzy. This can increase your chance of falling. Ask your doctor what other things that you can do to help prevent falls. This information is not intended to replace advice given to you by your health care provider. Make sure you discuss any questions you have with your health care provider. Document Released: 01/25/2009 Document Revised: 09/06/2015 Document Reviewed: 05/05/2014 Elsevier Interactive Patient Education  2017 [employer].

## 2022-12-09 NOTE — Progress Notes (Signed)
Subjective:   Tiffany Powers is a 67 y.o. female who presents for Medicare Annual (Subsequent) preventive examination.  Visit Complete: Virtual  I connected with  Tiffany Powers on 12/09/22 by a audio enabled telemedicine application and verified that I am speaking with the correct person using two identifiers.  Patient Location: Home  Provider Location: Office/Clinic  I discussed the limitations of evaluation and management by telemedicine. The patient expressed understanding and agreed to proceed.   Review of Systems     Cardiac Risk Factors include: advanced age (>83men, >96 women);dyslipidemia;diabetes mellitus;hypertension     Objective:   Vital Signs: Unable to obtain new vitals due to this being a telehealth visit.      12/09/2022    8:38 AM 12/02/2021   12:35 PM 12/07/2015    8:54 PM  Advanced Directives  Does Patient Have a Medical Advance Directive? Yes Yes No  Type of Estate agent of [place];Living will Healthcare Power of Attorney   Does patient want to make changes to medical advance directive? No - Patient declined    Copy of Healthcare Power of Attorney in Chart? No - copy requested No - copy requested   Would patient like information on creating a medical advance directive?   Yes - Educational materials given    Current Medications (verified) Outpatient Encounter Medications as of 12/09/2022  Medication Sig   ALPRAZolam (XANAX) 0.25 MG tablet Take 1 tablet (0.25 mg total) by mouth at bedtime. Use as needed for sleep   clopidogrel (PLAVIX) 75 MG tablet Take 1 tablet (75 mg total) by mouth daily.   Cyanocobalamin (VITAMIN B-12 PO) Take 1 tablet by mouth daily.   ezetimibe (ZETIA) 10 MG tablet Take 1 tablet (10 mg total) by mouth daily.   hydrochlorothiazide (HYDRODIURIL) 25 MG tablet Take 1 tablet (25 mg total) by mouth daily.   nitroGLYCERIN (NITROSTAT) 0.4 MG SL tablet Place 1 tablet (0.4 mg total) under the tongue every 5 (five) minutes  as needed for chest pain.   ondansetron (ZOFRAN) 4 MG tablet Take 1 tablet (4 mg total) by mouth every 8 (eight) hours as needed for nausea or vomiting.   rosuvastatin (CRESTOR) 40 MG tablet Take 1 tablet (40 mg total) by mouth daily.   telmisartan (MICARDIS) 40 MG tablet Take 1 tablet (40 mg total) by mouth daily.   [DISCONTINUED] benzonatate (TESSALON) 100 MG capsule Take 1 capsule (100 mg total) by mouth 3 (three) times daily as needed.   No facility-administered encounter medications on file as of 12/09/2022.    Allergies (verified) Patient has no known allergies.   History: Past Medical History:  Diagnosis Date   Diabetes mellitus without complication (HCC)    High cholesterol    Hypertension    MI (myocardial infarction) (HCC) 2001   NSTEMI (non-ST elevated myocardial infarction) (HCC) 12/07/2015   Past Surgical History:  Procedure Laterality Date   CARDIAC CATHETERIZATION  2001; 2002; 12/06/2105   CARDIAC CATHETERIZATION N/A 12/07/2015   Procedure: Left Heart Cath and Coronary Angiography;  Surgeon: Yates Decamp, MD;  Location: [facility] INVASIVE CV LAB;  Service: Cardiovascular;  Laterality: N/A;   TUBAL LIGATION  1980s   Family History  Problem Relation Age of Onset   CAD Father 45       MI   CAD Brother 76       MI   CAD Maternal Grandmother 37       CABG   Social History   Socioeconomic History  Marital status: Divorced    Spouse name: Not on file   Number of children: Not on file   Years of education: Not on file   Highest education level: Not on file  Occupational History   Not on file  Tobacco Use   Smoking status: Never    Passive exposure: Never   Smokeless tobacco: Never  Vaping Use   Vaping status: Never Used  Substance and Sexual Activity   Alcohol use: Yes    Alcohol/week: 1.0 standard drink of alcohol    Types: 1 Glasses of wine per week    Comment: occasionally   Drug use: No   Sexual activity: Not Currently    Birth control/protection:  Post-menopausal  Other Topics Concern   Not on file  Social History Narrative   Not on file   Social Determinants of Health   Financial Resource Strain: Low Risk  (12/09/2022)   Overall Financial Resource Strain (CARDIA)    Difficulty of Paying Living Expenses: Not hard at all  Food Insecurity: No Food Insecurity (12/09/2022)   Hunger Vital Sign    Worried About Running Out of Food in the Last Year: Never true    Ran Out of Food in the Last Year: Never true  Transportation Needs: No Transportation Needs (12/09/2022)   PRAPARE - Administrator, Civil Service (Medical): No    Lack of Transportation (Non-Medical): No  Physical Activity: Sufficiently Active (12/09/2022)   Exercise Vital Sign    Days of Exercise per Week: 6 days    Minutes of Exercise per Session: 60 min  Stress: No Stress Concern Present (12/09/2022)   [employer] of Occupational Health - Occupational Stress Questionnaire    Feeling of Stress : Not at all  Social Connections: Moderately Integrated (12/09/2022)   Social Connection and Isolation Panel [NHANES]    Frequency of Communication with Friends and Family: More than three times a week    Frequency of Social Gatherings with Friends and Family: More than three times a week    Attends Religious Services: More than 4 times per year    Active Member of [employer] or Organizations: Yes    Attends Engineer, structural: More than 4 times per year    Marital Status: Divorced    Tobacco Counseling Counseling given: Not Answered   Clinical Intake:  Pre-visit preparation completed: Yes  Pain : No/denies pain  Nutritional Risks: None Diabetes: Yes CBG done?: No Did pt. bring in CBG monitor from home?: No  How often do you need to have someone help you when you read instructions, pamphlets, or other written materials from your doctor or pharmacy?: 1 - Never  Interpreter Needed?: No  Information entered by :: Donne Anon, CMA   Activities  of Daily Living    12/09/2022    8:25 AM  In your present state of health, do you have any difficulty performing the following activities:  Hearing? 0  Vision? 0  Difficulty concentrating or making decisions? 0  Walking or climbing stairs? 0  Dressing or bathing? 0  Doing errands, shopping? 0  Preparing Food and eating ? N  Using the Toilet? N  In the past six months, have you accidently leaked urine? N  Do you have problems with loss of bowel control? N  Managing your Medications? N  Managing your Finances? N  Housekeeping or managing your Housekeeping? N    Patient Care Team: Copland, Gwenlyn Found, MD as PCP - General (Family  Medicine) Georgeanna Lea, MD as PCP - Cardiology (Cardiology)  Indicate any recent Medical Services you may have received from other than [facility] providers in the past year (date may be approximate).     Assessment:   This is a routine wellness examination for Donie.  Hearing/Vision screen No results found.  Dietary issues and exercise activities discussed:     Goals Addressed   None    Depression Screen    12/09/2022    8:33 AM 06/18/2022    2:33 PM 12/02/2021   12:38 PM 06/06/2021   10:27 AM  PHQ 2/9 Scores  PHQ - 2 Score 0 0 1 0    Fall Risk    12/09/2022    8:28 AM 06/18/2022    2:33 PM 12/02/2021   12:37 PM 06/06/2021   10:27 AM  Fall Risk   Falls in the past year? 0 0 0 0  Number falls in past yr: 0 0 0 0  Injury with Fall? 0 0 0 0  Risk for fall due to : No Fall Risks No Fall Risks    Follow up Falls evaluation completed  Falls prevention discussed     MEDICARE RISK AT HOME: Medicare Risk at Home Any stairs in or around the home?: No If so, are there any without handrails?: No Home free of loose throw rugs in walkways, pet beds, electrical cords, etc?: Yes Adequate lighting in your home to reduce risk of falls?: Yes Life alert?: No Use of a cane, walker or w/c?: No Grab bars in the bathroom?: No Shower chair or bench in  shower?: No Elevated toilet seat or a handicapped toilet?: No  TIMED UP AND GO:  Was the test performed?  No    Cognitive Function:        12/09/2022    8:34 AM 12/02/2021   12:43 PM  6CIT Screen  What Year? 0 points 0 points  What month? 0 points 0 points  What time? 0 points 0 points  Count back from 20 0 points 0 points  Months in reverse 0 points 0 points  Repeat phrase 2 points 0 points  Total Score 2 points 0 points    Immunizations Immunization History  Administered Date(s) Administered   Fluad Quad(high Dose 65+) 01/25/2021   Influenza, High Dose Seasonal PF 03/09/2022   Influenza-Unspecified 12/27/2019   Janssen (J&J) SARS-COV-2 Vaccination 08/13/2019, 03/14/2020   PNEUMOCOCCAL CONJUGATE-20 06/06/2021   Pfizer Covid-19 Vaccine Bivalent Booster 49yrs & up 01/23/2021, 03/09/2022   Pneumococcal Polysaccharide-23 02/28/2019   Td 12/30/2017   Tdap 04/14/2006    TDAP status: Up to date  Flu Vaccine status: Due, Education has been provided regarding the importance of this vaccine. Advised may receive this vaccine at local pharmacy or Health Dept. Aware to provide a copy of the vaccination record if obtained from local pharmacy or Health Dept. Verbalized acceptance and understanding.  Pneumococcal vaccine status: Up to date  Covid-19 vaccine status: Information provided on how to obtain vaccines.   Qualifies for Shingles Vaccine? Yes   Zostavax completed No   Shingrix Completed?: No.    Education has been provided regarding the importance of this vaccine. Patient has been advised to call insurance company to determine out of pocket expense if they have not yet received this vaccine. Advised may also receive vaccine at local pharmacy or Health Dept. Verbalized acceptance and understanding.  Screening Tests Health Maintenance  Topic Date Due   OPHTHALMOLOGY EXAM  Never done  Zoster Vaccines- Shingrix (1 of 2) Never done   COVID-19 Vaccine (5 - 2023-24 season)  05/04/2022   FOOT EXAM  06/06/2022   Medicare Annual Wellness (AWV)  12/03/2022   INFLUENZA VACCINE  11/13/2022   HEMOGLOBIN A1C  12/19/2022   Diabetic kidney evaluation - eGFR measurement  06/18/2023   Diabetic kidney evaluation - Urine ACR  06/18/2023   MAMMOGRAM  02/06/2024   DTaP/Tdap/Td (3 - Td or Tdap) 12/31/2027   Colonoscopy  10/01/2028   Pneumonia Vaccine 49+ Years old  Completed   DEXA SCAN  Completed   Hepatitis C Screening  Completed   HPV VACCINES  Aged Out    Health Maintenance  Health Maintenance Due  Topic Date Due   OPHTHALMOLOGY EXAM  Never done   Zoster Vaccines- Shingrix (1 of 2) Never done   COVID-19 Vaccine (5 - 2023-24 season) 05/04/2022   FOOT EXAM  06/06/2022   Medicare Annual Wellness (AWV)  12/03/2022   INFLUENZA VACCINE  11/13/2022    Colorectal cancer screening: Type of screening: Colonoscopy. Completed 10/01/21. Repeat every 7 years  Mammogram status: Completed 02/05/22. Repeat every year  Bone Density status: Completed 12/31/20. Results reflect: Bone density results: NORMAL. Repeat every 2 years.  Lung Cancer Screening: (Low Dose CT Chest recommended if Age 20-80 years, 20 pack-year currently smoking OR have quit w/in 15years.) does not qualify.   Additional Screening:  Hepatitis C Screening: does qualify; Completed 12/30/17  Vision Screening: Recommended annual ophthalmology exams for early detection of glaucoma and other disorders of the eye. Is the patient up to date with their annual eye exam?  Yes  Who is the provider or what is the name of the office in which the patient attends annual eye exams? [facility]. If pt is not established with a provider, would they like to be referred to a provider to establish care? No .   Dental Screening: Recommended annual dental exams for proper oral hygiene  Diabetic Foot Exam: Diabetic Foot Exam: Overdue, Pt has been advised about the importance in completing this exam. Pt is scheduled for  diabetic foot exam on N/a.  Community Resource Referral / Chronic Care Management: CRR required this visit?  No   CCM required this visit?  No     Plan:     I have personally reviewed and noted the following in the patient's chart:   Medical and social history Use of alcohol, tobacco or illicit drugs  Current medications and supplements including opioid prescriptions. Patient is not currently taking opioid prescriptions. Functional ability and status Nutritional status Physical activity Advanced directives List of other physicians Hospitalizations, surgeries, and ER visits in previous 12 months Vitals Screenings to include cognitive, depression, and falls Referrals and appointments  In addition, I have reviewed and discussed with patient certain preventive protocols, quality metrics, and best practice recommendations. A written personalized care plan for preventive services as well as general preventive health recommendations were provided to patient.     Donne Anon, CMA   12/09/2022   After Visit Summary: (MyChart) Due to this being a telephonic visit, the after visit summary with patients personalized plan was offered to patient via MyChart   Nurse Notes: None

## 2023-01-13 ENCOUNTER — Encounter: Payer: Self-pay | Admitting: Obstetrics and Gynecology

## 2023-01-13 ENCOUNTER — Ambulatory Visit: Payer: Medicare Other | Admitting: Obstetrics and Gynecology

## 2023-01-13 VITALS — BP 138/69 | HR 76 | Ht 58.78 in | Wt 136.0 lb

## 2023-01-13 DIAGNOSIS — N812 Incomplete uterovaginal prolapse: Secondary | ICD-10-CM | POA: Diagnosis not present

## 2023-01-13 NOTE — Progress Notes (Signed)
[facility]   Subjective:     Chief Complaint: Follow-up  History of Present Illness: Tiffany Powers is a 67 y.o. female here for follow up of prolapse. She feels the prolapse has gotten worse, more uncomfortable for her to sit. She used the pessary for a little bit but it caused some spotting taking it in and out so stopped using it. No longer noticed any spotting since removing pessary.    Past Medical History: Patient  has a past medical history of Diabetes mellitus without complication (HCC), High cholesterol, Hypertension, MI (myocardial infarction) (HCC) (2001), and NSTEMI (non-ST elevated myocardial infarction) (HCC) (12/07/2015).   Past Surgical History: She  has a past surgical history that includes Tubal ligation (1980s); Cardiac catheterization (2001; 2002; 12/06/2105); and Cardiac catheterization (N/A, 12/07/2015).   Medications: She has a current medication list which includes the following prescription(s): alprazolam, clopidogrel, cyanocobalamin, ezetimibe, hydrochlorothiazide, nitroglycerin, ondansetron, rosuvastatin, and telmisartan.   Allergies: Patient has No Known Allergies.   Social History: Patient  reports that she has never smoked. She has never been exposed to tobacco smoke. She has never used smokeless tobacco. She reports current alcohol use of about 1.0 standard drink of alcohol per week. She reports that she does not use drugs.      Objective:    BP 138/69   Pulse 76   Ht 4' 10.78" (1.493 m)   Wt 136 lb (61.7 kg)   BMI 27.67 kg/m  Gen: No apparent distress, A&O x 3. Pelvic Exam: Normal external female genitalia; Bartholin's and Skene's glands normal in appearance; urethral meatus normal in appearance, no urethral masses or discharge.   Complete procidentia noted. 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 Handout provided for her to review. Will follow up after urodynamic testing for finalize plan.  - she will need cardiac clearance, requested  Tiffany Beards, MD

## 2023-01-13 NOTE — Patient Instructions (Signed)

## 2023-01-15 ENCOUNTER — Ambulatory Visit
Admission: EM | Admit: 2023-01-15 | Discharge: 2023-01-15 | Disposition: A | Discharge status: Home / Self Care | Payer: Medicare Other | Attending: Internal Medicine | Admitting: Internal Medicine

## 2023-01-15 ENCOUNTER — Telehealth: Payer: Self-pay | Admitting: Cardiology

## 2023-01-15 ENCOUNTER — Telehealth: Payer: Self-pay

## 2023-01-15 ENCOUNTER — Ambulatory Visit: Payer: Medicare Other

## 2023-01-15 DIAGNOSIS — M25512 Pain in left shoulder: Secondary | ICD-10-CM

## 2023-01-15 DIAGNOSIS — G8929 Other chronic pain: Secondary | ICD-10-CM | POA: Diagnosis not present

## 2023-01-15 IMAGING — MG MM DIGITAL SCREENING BILAT W/ TOMO AND CAD
8 series · 8 of 24 positions shown · non-contrast
Comparison: None.

CLINICAL DATA: Screening.

EXAM:
DIGITAL SCREENING BILATERAL MAMMOGRAM WITH TOMOSYNTHESIS AND CAD
TECHNIQUE: Bilateral screening digital craniocaudal and mediolateral oblique
mammograms were obtained. Bilateral screening digital breast
tomosynthesis was performed. The images were evaluated with
computer-aided detection.

[R MLO synth-2D]
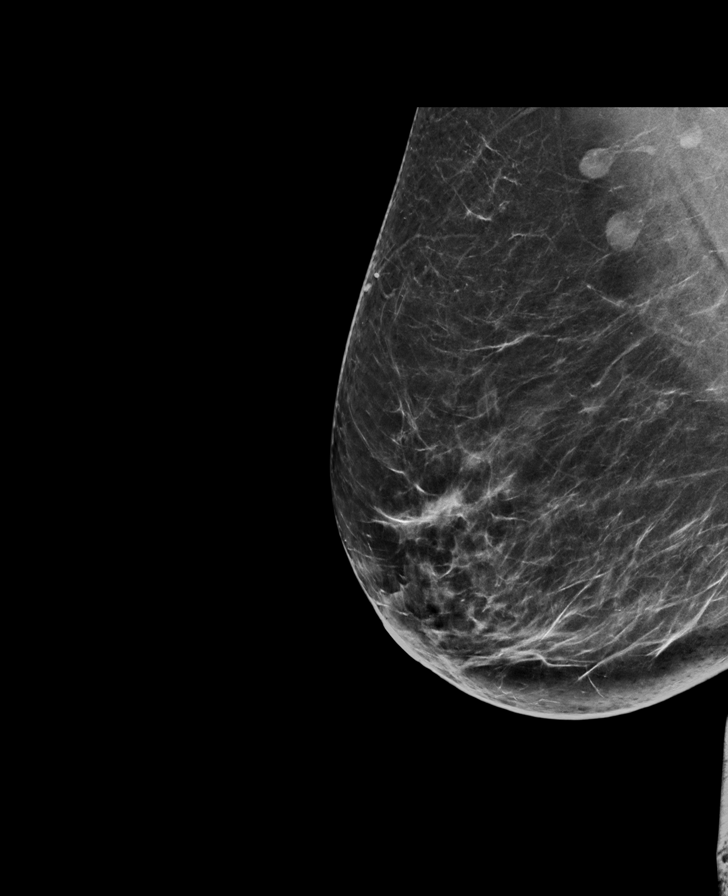

[L MLO synth-2D]
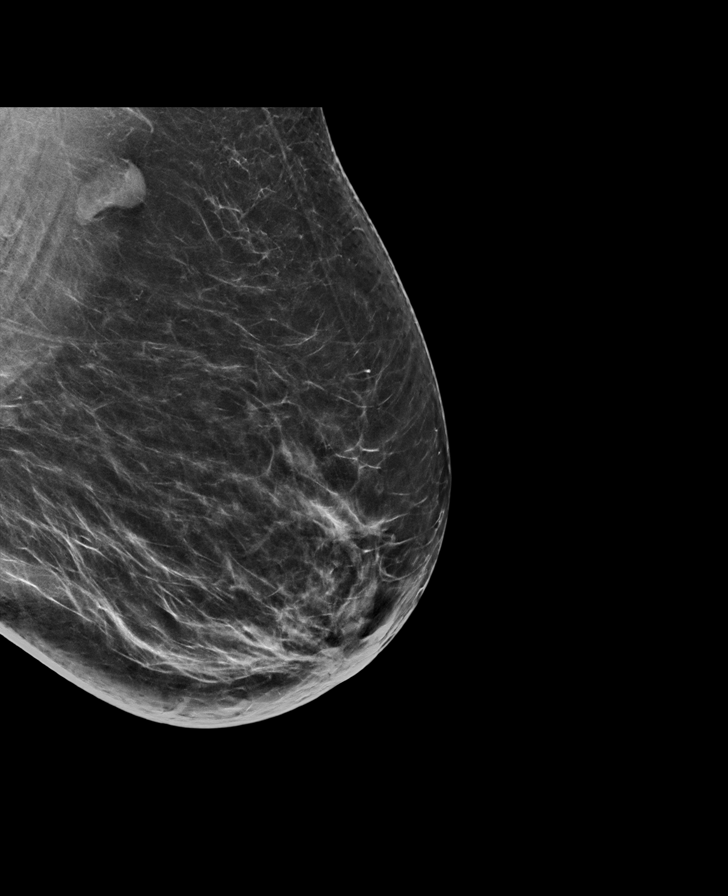

[R CC synth-2D]
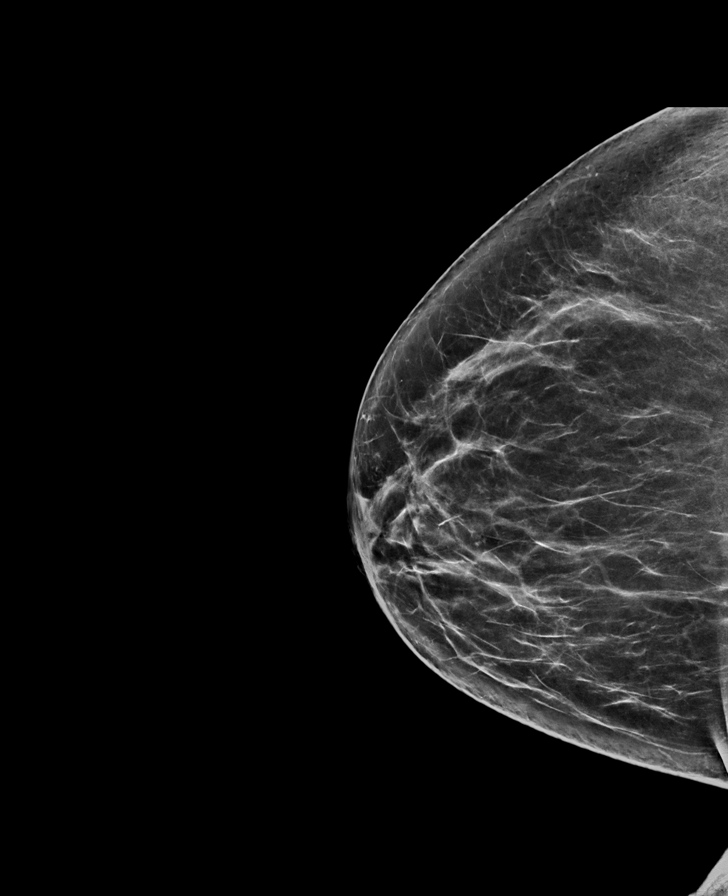

[L CC synth-2D]
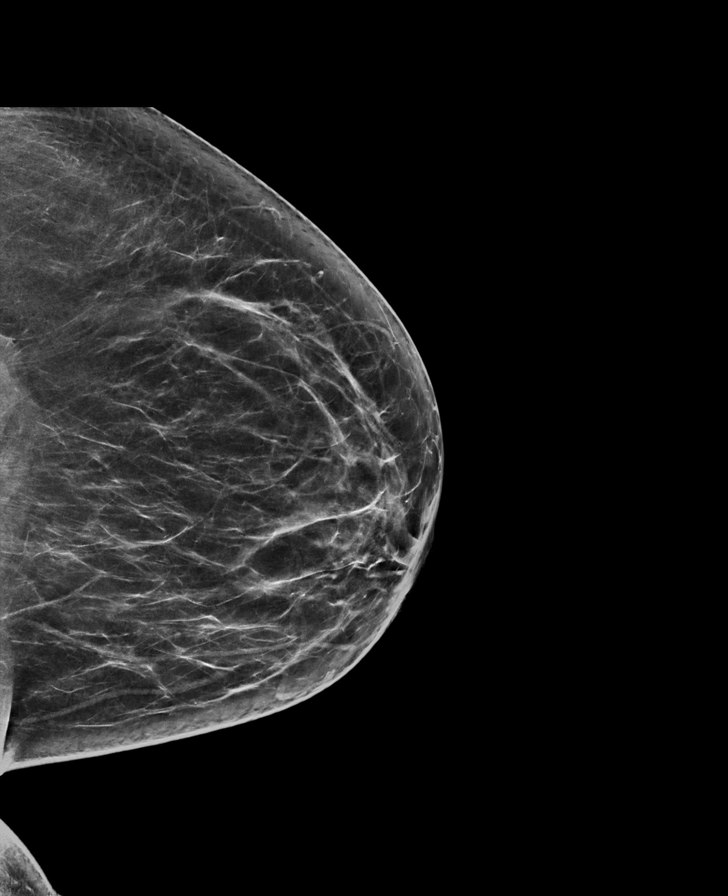

[R CC tomo · tomo slice 37/72.0]
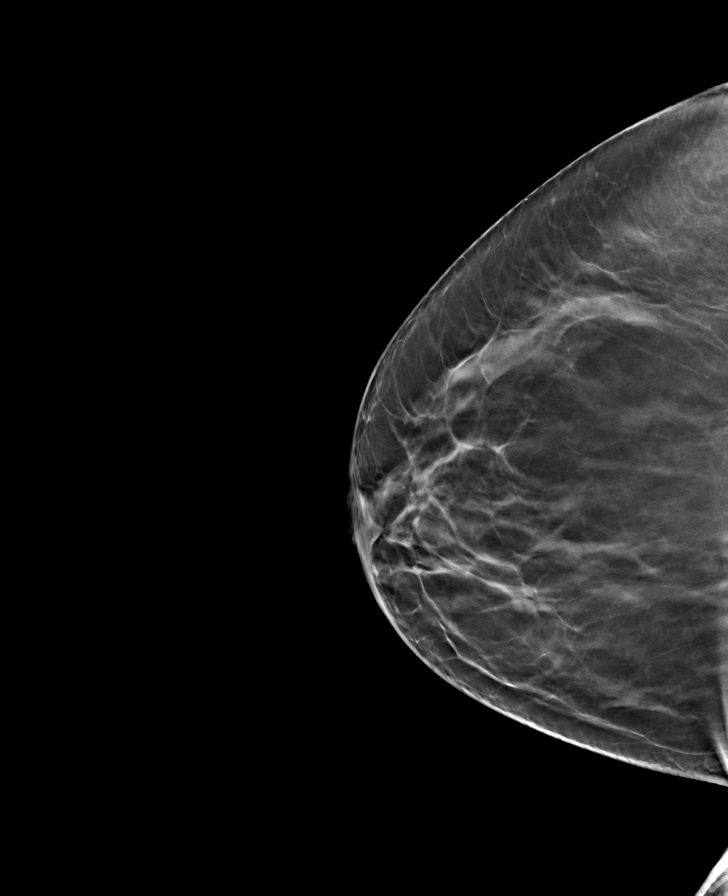

[L CC tomo · tomo slice 38/75.0]
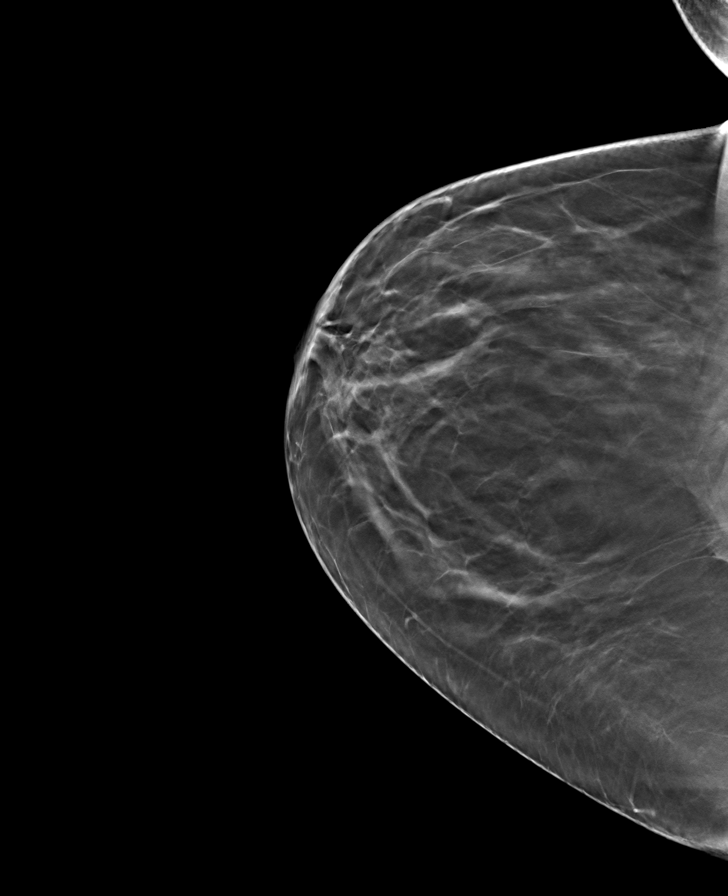

[R MLO tomo · tomo slice 41/80.0]
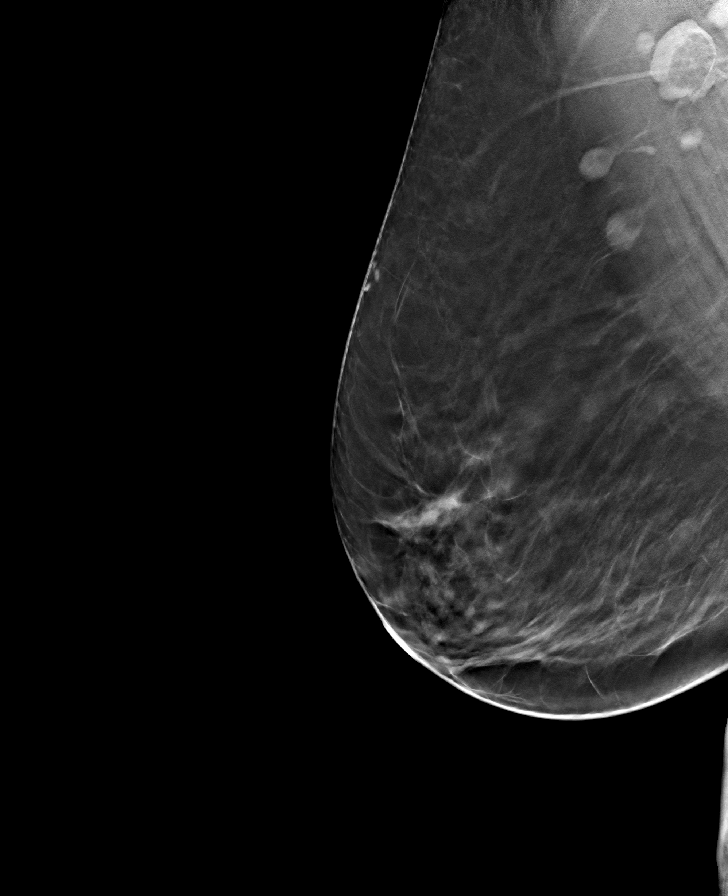

[L MLO tomo · tomo slice 41/80.0]
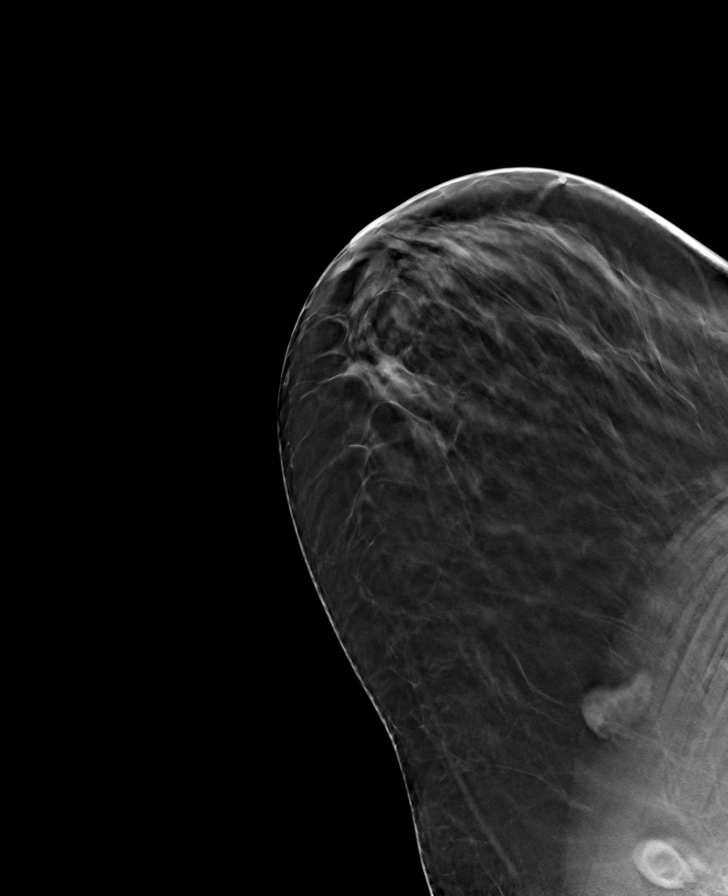

[8 of 24 positions shown; findings below may reference images not displayed]

ACR Breast Density Category b: There are scattered areas of
fibroglandular density.
FINDINGS: There are no findings suspicious for malignancy.
IMPRESSION: No mammographic evidence of malignancy. A result letter of this
screening mammogram will be mailed directly to the patient.

RECOMMENDATION:
Screening mammogram in one year. (Code:XG-X-X7B)

BI-RADS CATEGORY  1: Negative.

## 2023-01-15 MED ORDER — METHOCARBAMOL 500 MG PO TABS: 500.0000 mg | ORAL_TABLET | Freq: Two times a day (BID) | ORAL | 0 refills | Status: DC

## 2023-01-15 MED ORDER — PREDNISONE 10 MG PO TABS: 30.0000 mg | ORAL_TABLET | Freq: Every day | ORAL | 0 refills | Status: DC

## 2023-01-15 NOTE — ED Triage Notes (Signed)
Pt reports left shoulder pain x 6 weeks. Pain started when she was exercising. Biofreeze, cold/hot compress, Tylenol gives some relief. Pain is worse at night.

## 2023-01-15 NOTE — Telephone Encounter (Signed)
   Name: Tiffany Powers  DOB: 1955/06/30  MRN: [id number]  Primary Cardiologist: Gypsy Balsam, MD  Chart reviewed as part of pre-operative protocol coverage. Because of [name]'s past medical history and time since last visit, she will require a follow-up in-office visit in order to better assess preoperative cardiovascular risk.  Pre-op covering staff: - Please schedule appointment and call patient to inform them. Patient is seen by Dr. Bing Matter and does not have a follow up appointment scheduled. Last seen in 03/2022.  - Please contact requesting surgeon's office via preferred method (i.e, phone, fax) to inform them of need for appointment prior to surgery.  This message will also be routed to pharmacy pool and/or Dr Bing Matter for input on holding Plavix as requested below so that this information is available to the clearing provider at time of patient's appointment.   Joni Reining, NP  01/15/2023, 11:10 AM

## 2023-01-15 NOTE — Telephone Encounter (Signed)
Libby Maw, CMA; P Cv Div [facility]/Hp Scheduling LVM for pt to call and schedule pre op clearance appt/kbl 01/15/23

## 2023-01-15 NOTE — ED Provider Notes (Signed)
[facility]  Note:  This document was prepared using Conservation officer, historic buildings and may include unintentional dictation errors.  MRN: [id number] DOB: [birth year]  Subjective:   Tiffany Powers is a 67 y.o. female presenting for 6 week history of persistent progressively worsening left shoulder pain. No fall, trauma. Has been exercising consistently for the past 2 years, does walking and while she walks also exercises her arms. Specifically she throws a ball up and over her head, back and forth. This can aggravate her left shoulder pain. Has been using APAP; Advil, Aleve sparingly. Has a history of heart disease, two separate MIs. Last MI was in 2017, prior to that was in 2001. Her cardiologist did not use any kind of intervention for the last MI per patient. Was seen and deemed mild through heart catheterization.  Patient declines an EKG in clinic. No chest pain, shob, palpitations, heart racing, diaphoresis, neck or jaw pain. Diabetes is well controlled, last A1c was less than 7% in March 2024.   No current facility-administered medications for this encounter.  Current Outpatient Medications:    ALPRAZolam (XANAX) 0.25 MG tablet, Take 1 tablet (0.25 mg total) by mouth at bedtime. Use as needed for sleep, Disp: 30 tablet, Rfl: 1   clopidogrel (PLAVIX) 75 MG tablet, Take 1 tablet (75 mg total) by mouth daily., Disp: 90 tablet, Rfl: 3   Cyanocobalamin (VITAMIN B-12 PO), Take 1 tablet by mouth daily., Disp: , Rfl:    ezetimibe (ZETIA) 10 MG tablet, Take 1 tablet (10 mg total) by mouth daily., Disp: 90 tablet, Rfl: 3   hydrochlorothiazide (HYDRODIURIL) 25 MG tablet, Take 1 tablet (25 mg total) by mouth daily., Disp: 90 tablet, Rfl: 3   nitroGLYCERIN (NITROSTAT) 0.4 MG SL tablet, Place 1 tablet (0.4 mg total) under the tongue every 5 (five) minutes as needed for chest pain., Disp: 25 tablet, Rfl: 1   ondansetron (ZOFRAN) 4 MG tablet, Take 1 tablet (4 mg total) by mouth  every 8 (eight) hours as needed for nausea or vomiting., Disp: 20 tablet, Rfl: 0   rosuvastatin (CRESTOR) 40 MG tablet, Take 1 tablet (40 mg total) by mouth daily., Disp: 90 tablet, Rfl: 3   telmisartan (MICARDIS) 40 MG tablet, Take 1 tablet (40 mg total) by mouth daily., Disp: 90 tablet, Rfl: 3   No Known Allergies  Past Medical History:  Diagnosis Date   Diabetes mellitus without complication (HCC)    High cholesterol    Hypertension    MI (myocardial infarction) (HCC) 2001   NSTEMI (non-ST elevated myocardial infarction) (HCC) 12/07/2015     Past Surgical History:  Procedure Laterality Date   CARDIAC CATHETERIZATION  2001; 2002; 12/06/2105   CARDIAC CATHETERIZATION N/A 12/07/2015   Procedure: Left Heart Cath and Coronary Angiography;  Surgeon: Yates Decamp, MD;  Location: [facility] INVASIVE CV LAB;  Service: Cardiovascular;  Laterality: N/A;   TUBAL LIGATION  1980s    Family History  Problem Relation Age of Onset   CAD Father 33       MI   CAD Brother 25       MI   CAD Maternal Grandmother 50       CABG    Social History   Tobacco Use   Smoking status: Never    Passive exposure: Never   Smokeless tobacco: Never  Vaping Use   Vaping status: Never Used  Substance Use Topics   Alcohol use: Yes    Alcohol/week: 1.0 standard drink  of alcohol    Types: 1 Glasses of wine per week    Comment: occasionally   Drug use: No    ROS   Objective:   Vitals: BP (!) 149/72 (BP Location: Left Arm)   Pulse 61   Temp 98.4 F (36.9 C) (Oral)   Resp 18   SpO2 97%   Physical Exam Constitutional:      General: She is not in acute distress.    Appearance: Normal appearance. She is well-developed. She is not ill-appearing, toxic-appearing or diaphoretic.  HENT:     Head: Normocephalic and atraumatic.     Right Ear: External ear normal.     Left Ear: External ear normal.     Nose: Nose normal.     Mouth/Throat:     Mouth: Mucous membranes are moist.  Eyes:     General: No  scleral icterus.       Right eye: No discharge.        Left eye: No discharge.     Extraocular Movements: Extraocular movements intact.  Cardiovascular:     Rate and Rhythm: Normal rate.  Pulmonary:     Effort: Pulmonary effort is normal.  Musculoskeletal:     Left shoulder: Tenderness (+Hawkin; tenderness over AC joint, lateral deltoids, biceps tendon) and bony tenderness present. No swelling, deformity, effusion, laceration or crepitus. Decreased range of motion. Normal strength.  Skin:    General: Skin is warm and dry.  Neurological:     General: No focal deficit present.     Mental Status: She is alert and oriented to person, place, and time.  Psychiatric:        Mood and Affect: Mood normal.        Behavior: Behavior normal.     Assessment and Plan :   PDMP not reviewed this encounter.  1. Chronic left shoulder pain    Patient declined EKG and I am in agreement given low suspicion for recurrent ACS, MI, NSTEMI. Advised Tylenol, shoulder rehab and 30mg  5 day course of prednisone. Follow up with an orthopedist as soon as possible. X-ray over-read was pending at time of discharge, recommended follow up with only abnormal results. Otherwise will not call for negative over-read. Patient was in agreement. Counseled patient on potential for adverse effects with medications prescribed/recommended today, ER and return-to-clinic precautions discussed, patient verbalized understanding.    Tiffany Powers, [place] 01/15/23 1243

## 2023-01-15 NOTE — Discharge Instructions (Addendum)
For diabetes or elevated blood sugar, please make sure you are limiting and avoiding starchy, carbohydrate foods like pasta, breads, sweet breads, pastry, rice, potatoes, desserts. These foods can elevate your blood sugar. Also, limit and avoid drinks that contain a lot of sugar such as sodas, sweet teas, fruit juices.  Drinking plain water will be much more helpful, try 64 ounces of water daily.  It is okay to flavor your water naturally by cutting cucumber, lemon, mint or lime, placing it in a picture with water and drinking it over a period of 24-48 hours as long as it remains refrigerated.  For elevated blood pressure, make sure you are monitoring salt in your diet.  Do not eat restaurant foods and limit processed foods at home. I highly recommend you prepare and cook your own foods at home.  Processed foods include things like frozen meals, pre-seasoned meats and dinners, deli meats, canned foods as these foods contain a high amount of sodium/salt.  Make sure you are paying attention to sodium labels on foods you buy at the grocery store. Buy your spices separately such as garlic powder, onion powder, cumin, cayenne, parsley flakes so that you can avoid seasonings that contain salt. However, salt-free seasonings are available and can be used, an example is Mrs. Dash and includes a lot of different mixtures that do not contain salt.  Lastly, when cooking using oils that are healthier for you is important. This includes olive oil, avocado oil, canola oil. We have discussed a lot of foods to avoid but below is a list of foods that can be very healthy to use in your diet whether it is for diabetes, cholesterol, high blood pressure, or in general healthy eating.  Salads - kale, spinach, cabbage, spring mix, arugula Fruits - avocadoes, 1/2 cup of berries (blueberries, raspberries, blackberries), apples, oranges, pomegranate, grapefruit, kiwi; limit starchy fruits like bananas, grapes, watermelon Vegetables -  asparagus, cauliflower, broccoli, green beans, brussel sprouts, bell peppers, beets; stay away from or limit starchy vegetables like potatoes, carrots, peas Other general foods - kidney beans, egg whites, almonds, walnuts, sunflower seeds, pumpkin seeds, fat free yogurt, almond milk, flax seeds, quinoa, oats  Meat - It is better to eat lean meats and limit your red meat including pork to once a week.  Wild caught fish, chicken breast are good options as they tend to be leaner sources of good protein. Still be mindful of the sodium labels for the meats you buy.  DO NOT EAT ANY FOODS ON THIS LIST THAT YOU ARE ALLERGIC TO. For more specific needs, I highly recommend consulting a dietician or nutritionist but this can definitely be a good starting point.

## 2023-01-15 NOTE — Telephone Encounter (Signed)
I will send message to scheduling team for Dr. Bing Matter to see where they can get the pt in for pre op appt.

## 2023-01-15 NOTE — Telephone Encounter (Signed)
   [facility] Pre-operative Risk Assessment    Request for surgical clearance:  What type of surgery is being performed?  Hysterectomy and sacrocolplpexy  When is this surgery scheduled? TBD   What type of clearance is required (medical clearance vs. Pharmacy clearance to hold med vs. Both)? Both  Are there any medications that need to be held prior to surgery and how long?Not specified   Practice name and name of physician performing surgery? Dr. Lanetta Inch at Urogynecology at [facility]   What is your office phone number: [phone]    7.   What is your office fax number: [phone]  8.   Anesthesia type (None, local, MAC, general) ? General    Tiburcio Pea Siyon Linck 01/15/2023, 9:36 AM  _________________________________________________________________   (provider comments below)

## 2023-01-15 NOTE — Telephone Encounter (Signed)
-----   Message from [place] sent at 01/15/2023  4:30 PM EDT ----- Regarding: RE: PRE OP APPT PLEASE LVM for pt to call and schedule pre op clearance appt/kbl 01/15/23 ----- Message ----- From: Tarri Fuller, CMA Sent: 01/15/2023  12:56 PM EDT To: [facility] Scheduling Subject: PRE OP APPT PLEASE                             Hi everyone,   Hope you all are doing ok. Per pre op APP Joni Reining, DNP pt needs appt in office. Can someone please see where you can get the pt. I appreciate the help :)  Thank you  Okey Regal    I will send message to scheduling team for Dr. Bing Matter to see where they can get the pt in for pre op appt.

## 2023-01-15 NOTE — Telephone Encounter (Signed)
LVM for pt to call and schedule pre op clearance appt/kbl 01/15/23

## 2023-01-19 NOTE — Telephone Encounter (Signed)
I will update all parties involved pt has appt 01/26/23 with Wallis Bamberg, NP for pre op clearance.

## 2023-01-19 NOTE — Telephone Encounter (Signed)
Patient called and scheduled with Tiffany Powers on Monday 01/26/23 in [place]. Patient aware and agreeable.  Thank you!

## 2023-01-23 ENCOUNTER — Other Ambulatory Visit: Payer: Self-pay

## 2023-01-23 DIAGNOSIS — E7800 Pure hypercholesterolemia, unspecified: Secondary | ICD-10-CM | POA: Insufficient documentation

## 2023-01-23 DIAGNOSIS — E119 Type 2 diabetes mellitus without complications: Secondary | ICD-10-CM | POA: Insufficient documentation

## 2023-01-23 NOTE — Progress Notes (Addendum)
Cardiology Office Note:  .   Date:  01/26/2023  ID:  Tiffany Powers, DOB [birth year], MRN [id number] PCP: Pearline Cables, MD  [facility] Providers Cardiologist:  Gypsy Balsam, MD    History of Present Illness: .   Tiffany Powers is a 67 y.o. female with a past medical history of CAD, DM2, dyslipidemia, carotid artery stenosis.  07/11/2021 echo EF 55 to 60%, grade 1 DD, mild MR 07/09/2021 carotid ultrasound mild stenosis bilaterally 12/07/2015 left heart cath nonobstructive CAD at ostial LAD, very small ramus intermediate was the culprit lesion 99% stenosis--was too small to intervene  Most recently evaluated by Dr. Bing Matter on 03/19/2022, she was doing well from a cardiac perspective, walking most days of the week.   She presents today accompanied by her daughter for follow-up of her CAD.  She has been doing well since she was last evaluated in office.  She continues to be very active, working out 6 days/week for an hour at a time--stairs/running/walking.  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  She offers no formal complaints from a cardiac perspective, she has intentionally lost approximately 22 pounds over the last few years through healthy lifestyle modifications. She denies chest pain, palpitations, dyspnea, pnd, orthopnea, n, v, dizziness, syncope, edema, weight gain, or early satiety.   ROS: Review of Systems  All other systems reviewed and are negative.    Studies Reviewed: Marland Kitchen   EKG Interpretation Date/Time:  Monday January 26 2023 15:12:26 EDT Ventricular Rate:  64 PR Interval:  168 QRS Duration:  84 QT Interval:  398 QTC Calculation: 410 R Axis:   195  Text Interpretation: Normal sinus rhythm with sinus arrhythmia   When compared with ECG of 08-Dec-2015 05:55, Left posterior fascicular block is no longer Present T wave inversion no longer evident in Lateral leads  Confirmed by Wallis Bamberg [phone]) on  01/26/2023 3:30:40 PM    Cardiac Studies & Procedures   CARDIAC CATHETERIZATION  CARDIAC CATHETERIZATION 12/07/2015  Narrative Normal LV systolic function, no wall motion of normality. EF 55-60%. Normal coronary arteries except for ostial LAD 22 at most 30% stenosis, otherwise smooth and normal. A very small ramus intermediate which measures less than 0.5 mm and has very small area of distribution has a hazy 99% stenosis, probably the culprit lesion. RCA dominant and smooth and normal, circumflex normal.  Rec: Medical therapy with aggressive risk factor reduction.  Disposition: Will be discharged home tomorrow with outpatient follow up.  Findings Coronary Findings Diagnostic  Dominance: Right  Left Anterior Descending  Ramus Intermedius Vessel is small. 0.5 mm vessel  Left Circumflex Vessel is angiographically normal.  Right Coronary Artery Vessel is angiographically normal.  Intervention  No interventions have been documented.     ECHOCARDIOGRAM  ECHOCARDIOGRAM COMPLETE 07/11/2021  Narrative ECHOCARDIOGRAM REPORT    Patient Name:   Tiffany Powers Date of Exam: 07/11/2021 Medical Rec #:  [id number]     Height:       61.0 in Accession #:    [id number]    Weight:       150.0 lb Date of Birth:  1955/06/23     BSA:          1.671 m Patient Age:    66 years      BP:           112/68 mmHg Patient Gender: F  HR:           57 bpm. Exam Location:  [place]  Procedure: 2D Echo, Cardiac Doppler, Color Doppler and 3D Echo  Indications:    Dyspnea  History:        Patient has no prior history of Echocardiogram examinations. Coronary artery disease involving native coronary artery of native heart without angina pectoris Primary hypertension Mixed hyperlipidemia.  Sonographer:    Roosvelt Maser RDCS Referring Phys: [id number] ROBERT J KRASOWSKI  IMPRESSIONS   1. Left ventricular ejection fraction, by estimation, is 55 to 60%. The left ventricle has normal  function. The left ventricle has no regional wall motion abnormalities. Left ventricular diastolic parameters are consistent with Grade I diastolic dysfunction (impaired relaxation). 2. Right ventricular systolic function is normal. The right ventricular size is normal. There is normal pulmonary artery systolic pressure. 3. The mitral valve is normal in structure. Mild mitral valve regurgitation. No evidence of mitral stenosis. 4. The aortic valve is normal in structure. Aortic valve regurgitation is not visualized. No aortic stenosis is present. 5. The inferior vena cava is normal in size with greater than 50% respiratory variability, suggesting right atrial pressure of 3 mmHg.  FINDINGS Left Ventricle: Left ventricular ejection fraction, by estimation, is 55 to 60%. The left ventricle has normal function. The left ventricle has no regional wall motion abnormalities. The left ventricular internal cavity size was normal in size. There is no left ventricular hypertrophy. Left ventricular diastolic parameters are consistent with Grade I diastolic dysfunction (impaired relaxation).  Right Ventricle: The right ventricular size is normal. No increase in right ventricular wall thickness. Right ventricular systolic function is normal. There is normal pulmonary artery systolic pressure. The tricuspid regurgitant velocity is 2.14 m/s, and with an assumed right atrial pressure of 3 mmHg, the estimated right ventricular systolic pressure is 21.3 mmHg.  Left Atrium: Left atrial size was normal in size.  Right Atrium: Right atrial size was normal in size.  Pericardium: There is no evidence of pericardial effusion.  Mitral Valve: The mitral valve is normal in structure. Mild mitral valve regurgitation. No evidence of mitral valve stenosis.  Tricuspid Valve: The tricuspid valve is normal in structure. Tricuspid valve regurgitation is trivial. No evidence of tricuspid stenosis.  Aortic Valve: The aortic  valve is normal in structure. Aortic valve regurgitation is not visualized. No aortic stenosis is present. Aortic valve mean gradient measures 5.0 mmHg. Aortic valve peak gradient measures 10.1 mmHg. Aortic valve area, by VTI measures 1.29 cm.  Pulmonic Valve: The pulmonic valve was normal in structure. Pulmonic valve regurgitation is not visualized. No evidence of pulmonic stenosis.  Aorta: The aortic root is normal in size and structure.  Venous: The inferior vena cava is normal in size with greater than 50% respiratory variability, suggesting right atrial pressure of 3 mmHg.  IAS/Shunts: No atrial level shunt detected by color flow Doppler.   LEFT VENTRICLE PLAX 2D LVIDd:         4.30 cm   Diastology LVIDs:         2.90 cm   LV e' medial:    6.74 cm/s LV PW:         0.90 cm   LV E/e' medial:  14.7 LV IVS:        0.80 cm   LV e' lateral:   9.14 cm/s LVOT diam:     1.80 cm   LV E/e' lateral: 10.8 LV SV:  47 LV SV Index:   28 LVOT Area:     2.54 cm  3D Volume EF: 3D EF:        60 % LV EDV:       126 ml LV ESV:       51 ml LV SV:        75 ml  RIGHT VENTRICLE RV Basal diam:  4.00 cm RV S prime:     10.80 cm/s TAPSE (M-mode): 2.7 cm  LEFT ATRIUM             Index        RIGHT ATRIUM           Index LA diam:        3.60 cm 2.15 cm/m   RA Area:     14.20 cm LA Vol (A2C):   57.8 ml 34.58 ml/m  RA Volume:   32.90 ml  19.68 ml/m LA Vol (A4C):   45.1 ml 26.98 ml/m LA Biplane Vol: 51.5 ml 30.81 ml/m AORTIC VALVE AV Area (Vmax):    1.36 cm AV Area (Vmean):   1.33 cm AV Area (VTI):     1.29 cm AV Vmax:           159.00 cm/s AV Vmean:          110.000 cm/s AV VTI:            0.364 m AV Peak Grad:      10.1 mmHg AV Mean Grad:      5.0 mmHg LVOT Vmax:         84.90 cm/s LVOT Vmean:        57.300 cm/s LVOT VTI:          0.185 m LVOT/AV VTI ratio: 0.51  AORTA Ao Root diam: 2.80 cm Ao Asc diam:  2.50 cm  MITRAL VALVE                TRICUSPID VALVE MV Area  (PHT): 3.54 cm     TR Peak grad:   18.3 mmHg MV Decel Time: 214 msec     TR Vmax:        214.00 cm/s MV E velocity: 99.00 cm/s MV A velocity: 114.00 cm/s  SHUNTS MV E/A ratio:  0.87         Systemic VTI:  0.18 m Systemic Diam: 1.80 cm  Belva Crome MD Electronically signed by Belva Crome MD Signature Date/Time: 07/11/2021/10:50:55 AM    Final             Risk Assessment/Calculations:             Physical Exam:   VS:  BP 132/76   Pulse 64   Ht 5' (1.524 m)   Wt 138 lb 6.4 oz (62.8 kg)   SpO2 96%   BMI 27.03 kg/m    Wt Readings from Last 3 Encounters:  01/26/23 138 lb 6.4 oz (62.8 kg)  01/13/23 136 lb (61.7 kg)  06/18/22 147 lb (66.7 kg)    GEN: Appears younger than stated age, well nourished, well developed in no acute distress NECK: No JVD; No carotid bruits CARDIAC: RRR, no murmurs, rubs, gallops RESPIRATORY:  Clear to auscultation without rales, wheezing or rhonchi  ABDOMEN: Soft, non-tender, non-distended EXTREMITIES:  No edema; No deformity   ASSESSMENT AND PLAN: .   CAD-left heart cath in 2017 revealed nonobstructive CAD in the LAD, small ramus with 99% stenosed was too small to intervene upon.  She  has had no episodes of chest pain, no indications for ischemic evaluation.  Continue Plavix 75 mg daily, continue Zetia 10 mg daily, continue Crestor 40 mg daily, continue nitroglycerin PRN-- has not needed.  HTN -blood pressure is controlled today at 132/76, continue Micardis 40 mg daily, continue HCTZ 25 mg daily.  Dyslipidemia-formally monitored by her PCP, most recent LDL was 84, prefer this to be less than 70.  She is on Zetia as well as Crestor.  She has intentionally lost more weight since this time and anticipate her LDL will be in the acceptable range the next time it is checked.    Carotid artery stenosis-mild bilaterally per most recent ultrasound in 2023, continue with medication regimen as outlined above.  Preoperative cardiovascular  evaluation-upcoming urogynecological procedure. According to the Revised Cardiac Risk Index (RCRI), her Perioperative Risk of Major Cardiac Event is (%): 0.9 Her Functional Capacity in METs is: 7.59 according to the Duke Activity Status Index (DASI). Therefore, based on ACC/AHA guidelines, patient would be at acceptable risk for the planned procedure without further cardiovascular testing.  Regarding her Plavix, this can be held for 5 days and resumed once hemostasis has occurred at the discretion of the surgeon.  I will route this recommendation to the requesting party via Epic fax function.    Dispo: Follow up in 1 year.   Signed, Flossie Dibble, NP

## 2023-01-26 ENCOUNTER — Ambulatory Visit: Payer: Medicare Other | Attending: Cardiology | Admitting: Cardiology

## 2023-01-26 ENCOUNTER — Encounter: Payer: Self-pay | Admitting: Cardiology

## 2023-01-26 VITALS — BP 132/76 | HR 64 | Ht 60.0 in | Wt 138.4 lb

## 2023-01-26 DIAGNOSIS — Z01810 Encounter for preprocedural cardiovascular examination: Secondary | ICD-10-CM

## 2023-01-26 DIAGNOSIS — I6523 Occlusion and stenosis of bilateral carotid arteries: Secondary | ICD-10-CM | POA: Diagnosis not present

## 2023-01-26 DIAGNOSIS — I2510 Atherosclerotic heart disease of native coronary artery without angina pectoris: Secondary | ICD-10-CM | POA: Diagnosis not present

## 2023-01-26 DIAGNOSIS — E119 Type 2 diabetes mellitus without complications: Secondary | ICD-10-CM | POA: Diagnosis not present

## 2023-01-26 DIAGNOSIS — I10 Essential (primary) hypertension: Secondary | ICD-10-CM | POA: Diagnosis not present

## 2023-01-26 DIAGNOSIS — E782 Mixed hyperlipidemia: Secondary | ICD-10-CM

## 2023-01-26 NOTE — Patient Instructions (Signed)
Medication Instructions:  Your physician recommends that you continue on your current medications as directed. Please refer to the Current Medication list given to you today.  *If you need a refill on your cardiac medications before your next appointment, please call your pharmacy*   Lab Work: NONE If you have labs (blood work) drawn today and your tests are completely normal, you will receive your results only by: MyChart Message (if you have MyChart) OR A paper copy in the mail If you have any lab test that is abnormal or we need to change your treatment, we will call you to review the results.   Testing/Procedures: NONE   Follow-Up: At [facility], you and your health needs are our priority.  As part of our continuing mission to provide you with exceptional heart care, we have created designated Provider Care Teams.  These Care Teams include your primary Cardiologist (physician) and Advanced Practice Providers (APPs -  Physician Assistants and Nurse Practitioners) who all work together to provide you with the care you need, when you need it.  We recommend signing up for the patient portal called "MyChart".  Sign up information is provided on this After Visit Summary.  MyChart is used to connect with patients for Virtual Visits (Telemedicine).  Patients are able to view lab/test results, encounter notes, upcoming appointments, etc.  Non-urgent messages can be sent to your provider as well.   To learn more about what you can do with MyChart, go to [email or web address].    Your next appointment:   1 year(s)  Provider:   Gypsy Balsam, MD    Other Instructions

## 2023-02-04 ENCOUNTER — Ambulatory Visit: Payer: Medicare Other | Admitting: Obstetrics and Gynecology

## 2023-02-04 ENCOUNTER — Encounter: Payer: Self-pay | Admitting: Obstetrics and Gynecology

## 2023-02-04 VITALS — BP 151/69 | HR 73

## 2023-02-04 DIAGNOSIS — N3281 Overactive bladder: Secondary | ICD-10-CM

## 2023-02-04 DIAGNOSIS — R350 Frequency of micturition: Secondary | ICD-10-CM

## 2023-02-04 LAB — POCT URINALYSIS DIPSTICK
Bilirubin, UA: NEGATIVE
Blood, UA: NEGATIVE
Glucose, UA: NEGATIVE
Ketones, UA: NEGATIVE
Leukocytes, UA: NEGATIVE
Nitrite, UA: NEGATIVE
Protein, UA: NEGATIVE
Spec Grav, UA: 1.015 (ref 1.010–1.025)
Urobilinogen, UA: 0.2 U/dL
pH, UA: 6.5 (ref 5.0–8.0)

## 2023-02-04 MED ORDER — VIBEGRON 75 MG PO TABS: 75.0000 mg | ORAL_TABLET | Freq: Every day | ORAL | 5 refills | Status: DC

## 2023-02-04 NOTE — Progress Notes (Unsigned)
[facility] Urodynamics Procedure  Referring Physician: Pearline Cables, MD Date of Procedure: 02/04/2023  Tiffany Powers is a 67 y.o. female who presents for urodynamic evaluation. Indication(s) for study: occult SUI  Vital Signs: BP (!) 151/69   Pulse 73   Laboratory Results: A catheterized urine specimen revealed:  Lab Results  Component Value Date   COLORU yellow 02/04/2023   CLARITYU clear 02/04/2023   GLUCOSEUR Negative 02/04/2023   BILIRUBINUR Negative 02/04/2023   KETONESU Negative 02/04/2023   SPECGRAV 1.015 02/04/2023   RBCUR Negative 02/04/2023   PHUR 6.5 02/04/2023   PROTEINUR Negative 02/04/2023   UROBILINOGEN 0.2 02/04/2023   LEUKOCYTESUR Negative 02/04/2023       Voiding Diary: Not Done  Procedure Timeout:  The correct patient was verified and the correct procedure was verified. The patient was in the correct position and safety precautions were reviewed based on at the patient's history.  Urodynamic Procedure A 64F dual lumen urodynamics catheter was placed under sterile conditions into the patient's bladder. A 64F catheter was placed into the rectum in order to measure abdominal pressure. EMG patches were placed in the appropriate position.  All connections were confirmed and calibrations/adjusted made. Saline was instilled into the bladder through the dual lumen catheters.  Cough/valsalva pressures were measured periodically during filling.  Patient was allowed to void.  The bladder was then emptied of its residual.  UROFLOW: Revealed a Qmax of 6.9 mL/sec.  She voided 54 mL and had a residual of 50 mL.  It was a normal pattern and represented normal habits though interpretation limited due to low voided volume.  CMG: This was performed with sterile water in the sitting position at a fill rate of 20-30 mL/min.    First sensation of fullness was 50 mLs,  First urge was 86 mLs,  Strong urge was 134 mLs and  Capacity was 308 mLs  Stress  incontinence was not demonstrated {highest/ lowest:24787} {Desc; negative/positive:13464} Barrier CLPP was *** cmH20 at *** ml. {highest/ lowest:24787} {Desc; negative/positive:13464} Barrier VLPP was *** cmH20 at *** ml.  Detrusor function was {Desc; normal/underactive/overactive:13463}, {With/no:32556} phasic contractions seen.  The first occurred at *** mL to *** cm of water and {WAS/WAS [phone]::"was not"} associated with {urge/ EXBM:84132}.  Compliance:  ***. End fill detrusor pressure was ***cmH20.  Calculated compliance was ***GM/WNU27  UPP: MUCP {With/without:5700} barrier reduction was *** cm of water.    MICTURITION STUDY: Voiding was performed {reduction:24791} in the sitting position.  Pdet at Qmax was *** cm of water.  Qmax was *** mL/sec.  It was a {uroflow:24789} pattern.  She voided *** mL and had a residual of *** mL.  It was a volitional void, sustained detrusor contraction was {DESC; PRESENT/NOT PRESENT:21021351} and abdominal straining was {DESC; PRESENT/NOT PRESENT:21021351}  EMG: This was performed with patches.  She had voluntary contractions, recruitment with fill was {DESC; PRESENT/NOT PRESENT:21021351} and urethral sphincter was {relaxed:24792} with void.  The details of the procedure with the study tracings have been scanned into EPIC.   Urodynamic Impression:  1. Sensation was {DESC; NORMAL/REDUCED/ABSENT/INCREASED:23133}; capacity was {DESC; NORMAL/REDUCED/ABSENT/INCREASED:23133} 2. Stress Incontinence {WAS/WAS [phone]::"was not"} demonstrated at {ISD:24793} pressures; 3. Detrusor Overactivity {WAS/WAS [phone]::"was not"} demonstrated {With-without:32421} leakage. 4. Emptying was {dysfunctional:24794} with a {elevated:24795} PVR, a sustained detrusor contraction {DESC; PRESENT/NOT PRESENT:21021351},  abdominal straining {DESC; PRESENT/NOT PRESENT:21021351}, {dyssynergic:24796} urethral sphincter activity on EMG.  Plan: - The patient will  follow up  to discuss the findings and treatment options.

## 2023-02-17 ENCOUNTER — Ambulatory Visit: Payer: Medicare Other | Admitting: Obstetrics and Gynecology

## 2023-02-17 ENCOUNTER — Encounter: Payer: Self-pay | Admitting: Obstetrics and Gynecology

## 2023-02-17 VITALS — BP 130/69 | HR 76

## 2023-02-17 DIAGNOSIS — N3281 Overactive bladder: Secondary | ICD-10-CM | POA: Diagnosis not present

## 2023-02-17 DIAGNOSIS — N813 Complete uterovaginal prolapse: Secondary | ICD-10-CM | POA: Diagnosis not present

## 2023-02-17 NOTE — Progress Notes (Signed)
Tiffany Powers  SUBJECTIVE  History of Present Illness: Tiffany Powers is a 67 y.o. female seen in follow-up after urodynamic testing.   Urodynamic Impression:  1. Sensation was increased; capacity was low-normal 2. Stress Incontinence was not demonstrated at normal pressures; 3. Detrusor Overactivity was demonstrated without leakage. 4. Emptying was normal with a normal PVR, a sustained detrusor contraction present,  abdominal straining not present, normal urethral sphincter activity on EMG.  Requested cardiac clearance. Had appt on 10/14:  patient would be at acceptable risk for the planned procedure without further cardiovascular testing.  Regarding her Plavix, this can be held for 5 days and resumed once hemostasis has occurred at the discretion of the surgeon.   She has been on the gemtesa for about a week. Not sure she has noticed a difference.   Past Medical History: Patient  has a past medical history of Adjustment insomnia (07/30/2016), Carpal tunnel syndrome of right wrist (10/19/2019), Controlled type 2 diabetes mellitus without complication, without long-term current use of insulin (HCC) (12/31/2017), Coronary artery disease cardiac catheterization 2017 showing 99% stenosis of small intermediate branch, up to 30% proximal LAD (07/01/2021), Diabetes mellitus without complication (HCC), High cholesterol, Hyperlipidemia (07/30/2016), Hypertension, MI (myocardial infarction) (HCC) (2001), NSTEMI (non-ST elevated myocardial infarction) (HCC) (12/07/2015), and Pure hypercholesterolemia (02/19/2017).   Past Surgical History: She  has a past surgical history that includes Tubal ligation (1980s); Cardiac catheterization (2001; 2002; 12/06/2105); and Cardiac catheterization (N/A, 12/07/2015).   Medications: She has a current medication list which includes the following prescription(s): acetaminophen, alprazolam, clopidogrel, cyanocobalamin, ezetimibe,  hydrochlorothiazide, methocarbamol, nitroglycerin, ondansetron, prednisone, rosuvastatin, telmisartan, and vibegron.   Allergies: Patient has No Known Allergies.   Social History: Patient  reports that she has never smoked. She has never been exposed to tobacco smoke. She has never used smokeless tobacco. She reports current alcohol use of about 1.0 standard drink of alcohol per week. She reports that she does not use drugs.      OBJECTIVE     Physical Exam: Vitals:   02/17/23 1429  BP: 130/69  Pulse: 76   Gen: No apparent distress, A&O x 3.  Detailed Urogynecologic Evaluation:  Deferred. Prior exam showed:  POP-Q   3                                            Aa   5                                           Ba   5                                              C    6                                            Gh   3.5  Pb   6                                            tvl    3                                            Ap   4                                            Bp   -1                                              D        ASSESSMENT AND PLAN    Tiffany Powers is a 67 y.o. with:  1. Uterovaginal prolapse, complete   2. Overactive bladder    - Continue with gemtesa for OAB symptoms. If she sees improvement in the next month, then can give a prescription. If not, can try another medication. She will message [place] in about a month.   Plan for surgery: Exam under anesthesia, robotic assisted total laparoscopic hysterectomy, bilateral salpingo-oophorectomy, sacrocolpopexy, perineorrhaphy, cystoscopy  - We reviewed the patient's specific anatomic and functional findings, with the assistance of diagrams, and together finalized the above procedure. The planned surgical procedures were discussed along with the surgical risks outlined below, which were also provided on a detailed handout. Additional treatment options including  expectant management, conservative management, medical management were discussed where appropriate.  We reviewed the benefits and risks of each treatment option.   General Surgical Risks: For all procedures, there are risks of bleeding, infection, damage to surrounding organs including but not limited to bowel, bladder, blood vessels, ureters and nerves, and need for further surgery if an injury were to occur. These risks are all low with minimally invasive surgery.   There are risks of numbness and weakness at any body site or buttock/rectal pain.  It is possible that baseline pain can be worsened by surgery, either with or without mesh. If surgery is vaginal, there is also a low risk of possible conversion to laparoscopy or open abdominal incision where indicated. Very rare risks include blood transfusion, blood clot, heart attack, pneumonia, or death.   There is also a risk of short-term postoperative urinary retention with need to use a catheter. About half of patients need to go home from surgery with a catheter, which is then later removed in the office. The risk of long-term need for a catheter is very low. There is also a risk of worsening of overactive bladder.    Prolapse (with or without mesh): Risk factors for surgical failure  include things that put pressure on your pelvis and the surgical repair, including obesity, chronic cough, and heavy lifting or straining (including lifting children or adults, straining on the toilet, or lifting heavy objects such as furniture or anything weighing >25 lbs. Risks of recurrence is 20-30% with vaginal native tissue repair and a less than 10% with sacrocolpopexy with mesh.  Sacrocolpopexy: Mesh implants may provide more prolapse support, but do have some unique risks to consider. It is important to understand that mesh is permanent and cannot be easily removed. Risks of abdominal sacrocolpopexy mesh include mesh exposure (~3-6%), painful intercourse  (recent studies show lower rates after surgery compared to before, with ~5-8% risk of new onset), and very rare risks of bowel or bladder injury or infection (<1%). The risk of mesh exposure is more likely in a woman with risks for poor healing (prior radiation, poorly controlled diabetes, or immunocompromised). The risk of new or worsened chronic pain after mesh implant is more common in women with baseline chronic pain and/or poorly controlled anxiety or depression. There is an FDA safety notification on vaginal mesh procedures for prolapse but NOT abdominal mesh procedures and therefore does not apply to your surgery. We have extensive experience and training with mesh placement and we have close postoperative follow up to identify any potential complications from mesh.    - For preop Powers:  She is required to have a Powers within 30 days of her surgery.    - Medical clearance: required from Cardiology- see above - Anticoagulant use: On plavix- plan to hold 5 days prior to procedure - Medicaid Hysterectomy form: No - Accepts blood transfusion: Yes - Expected length of stay: outpatient   Marguerita Beards, MD

## 2023-03-03 ENCOUNTER — Encounter (HOSPITAL_BASED_OUTPATIENT_CLINIC_OR_DEPARTMENT_OTHER): Payer: Self-pay | Admitting: *Deleted

## 2023-03-25 ENCOUNTER — Other Ambulatory Visit (HOSPITAL_BASED_OUTPATIENT_CLINIC_OR_DEPARTMENT_OTHER): Payer: Self-pay | Admitting: Family Medicine

## 2023-03-25 DIAGNOSIS — Z139 Encounter for screening, unspecified: Secondary | ICD-10-CM

## 2023-03-26 ENCOUNTER — Ambulatory Visit (HOSPITAL_BASED_OUTPATIENT_CLINIC_OR_DEPARTMENT_OTHER)
Admission: RE | Admit: 2023-03-26 | Discharge: 2023-03-26 | Disposition: A | Discharge status: Home / Self Care | Payer: Medicare Other | Source: Ambulatory Visit | Attending: Family Medicine | Admitting: Family Medicine

## 2023-03-26 ENCOUNTER — Encounter (HOSPITAL_BASED_OUTPATIENT_CLINIC_OR_DEPARTMENT_OTHER): Payer: Self-pay

## 2023-03-26 DIAGNOSIS — Z1231 Encounter for screening mammogram for malignant neoplasm of breast: Secondary | ICD-10-CM | POA: Insufficient documentation

## 2023-03-26 DIAGNOSIS — Z139 Encounter for screening, unspecified: Secondary | ICD-10-CM | POA: Diagnosis present

## 2023-05-08 ENCOUNTER — Ambulatory Visit (INDEPENDENT_AMBULATORY_CARE_PROVIDER_SITE_OTHER): Payer: Medicare Other | Admitting: Obstetrics and Gynecology

## 2023-05-08 VITALS — BP 119/72 | HR 84 | Wt 141.0 lb

## 2023-05-08 DIAGNOSIS — N813 Complete uterovaginal prolapse: Secondary | ICD-10-CM

## 2023-05-08 DIAGNOSIS — Z01818 Encounter for other preprocedural examination: Secondary | ICD-10-CM | POA: Diagnosis not present

## 2023-05-08 NOTE — Progress Notes (Unsigned)
[facility] Return visit  Subjective Chief Complaint: Tiffany Powers presents for a evaluation of prolapse  History of Present Illness: Tiffany Powers is a 68 y.o. female. She is scheduled to undergo Exam under anesthesia, robotic assisted total laparoscopic hysterectomy, bilateral salpingo-oophorectomy, sacrocolpopexy, perineorrhaphy, cystoscopy on 06/08/23.  Her symptoms include vaginal bulge, and she was was found to have Stage IV anterior, Stage IV posterior, Stage IV apical prolapse.   Urodynamic Impression:  1. Sensation was increased; capacity was low-normal 2. Stress Incontinence was not demonstrated at normal pressures; 3. Detrusor Overactivity was demonstrated without leakage. 4. Emptying was normal with a normal PVR, a sustained detrusor contraction present,  abdominal straining not present, normal urethral sphincter activity on EMG.  Past Medical History:  Diagnosis Date   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   Carpal tunnel syndrome of right wrist 10/19/2019   Controlled type 2 diabetes mellitus without complication, without long-term current use of insulin (HCC) 12/31/2017   Coronary artery disease cardiac catheterization 2017 showing 99% stenosis of small intermediate branch, up to 30% proximal LAD 07/01/2021   Diabetes mellitus without complication (HCC)    High cholesterol    Hyperlipidemia 07/30/2016   Hypertension    MI (myocardial infarction) (HCC) 2001   NSTEMI (non-ST elevated myocardial infarction) (HCC) 12/07/2015   Pure hypercholesterolemia 02/19/2017     Past Surgical History:  Procedure Laterality Date   CARDIAC CATHETERIZATION  2001; 2002; 12/06/2105   CARDIAC CATHETERIZATION N/A 12/07/2015   Procedure: Left Heart Cath and Coronary Angiography;  Surgeon: Yates Decamp, MD;  Location: [facility] INVASIVE CV LAB;  Service: Cardiovascular;  Laterality: N/A;   TUBAL LIGATION  1980s    has no known allergies.   Family History  Problem Relation Age of Onset    CAD Father 80       MI   CAD Brother 50       MI   CAD Maternal Grandmother 28       CABG    Social History   Tobacco Use   Smoking status: Never    Passive exposure: Never   Smokeless tobacco: Never  Vaping Use   Vaping status: Never Used  Substance Use Topics   Alcohol use: Yes    Alcohol/week: 1.0 standard drink of alcohol    Types: 1 Glasses of wine per week    Comment: occasionally   Drug use: No     Review of Systems was negative for a full 10 system review except as noted in the History of Present Illness.   Current Outpatient Medications:    acetaminophen (TYLENOL) 500 MG tablet, Take 500 mg by mouth every 6 (six) hours as needed for mild pain or moderate pain., Disp: , Rfl:    ALPRAZolam (XANAX) 0.25 MG tablet, Take 1 tablet (0.25 mg total) by mouth at bedtime. Use as needed for sleep, Disp: 30 tablet, Rfl: 1   clopidogrel (PLAVIX) 75 MG tablet, Take 1 tablet (75 mg total) by mouth daily., Disp: 90 tablet, Rfl: 3   Cyanocobalamin (VITAMIN B-12 PO), Take 1 tablet by mouth daily., Disp: , Rfl:    ezetimibe (ZETIA) 10 MG tablet, Take 1 tablet (10 mg total) by mouth daily., Disp: 90 tablet, Rfl: 3   hydrochlorothiazide (HYDRODIURIL) 25 MG tablet, Take 1 tablet (25 mg total) by mouth daily., Disp: 90 tablet, Rfl: 3   methocarbamol (ROBAXIN) 500 MG tablet, Take 1 tablet (500 mg total) by mouth 2 (two) times daily., Disp: 20 tablet, Rfl: 0   nitroGLYCERIN (  NITROSTAT) 0.4 MG SL tablet, Place 1 tablet (0.4 mg total) under the tongue every 5 (five) minutes as needed for chest pain., Disp: 25 tablet, Rfl: 1   ondansetron (ZOFRAN) 4 MG tablet, Take 1 tablet (4 mg total) by mouth every 8 (eight) hours as needed for nausea or vomiting., Disp: 20 tablet, Rfl: 0   predniSONE (DELTASONE) 10 MG tablet, Take 3 tablets (30 mg total) by mouth daily with breakfast., Disp: 15 tablet, Rfl: 0   rosuvastatin (CRESTOR) 40 MG tablet, Take 1 tablet (40 mg total) by mouth daily., Disp: 90 tablet,  Rfl: 3   telmisartan (MICARDIS) 40 MG tablet, Take 1 tablet (40 mg total) by mouth daily., Disp: 90 tablet, Rfl: 3   Vibegron 75 MG TABS, Take 1 tablet (75 mg total) by mouth daily. (Patient not taking: Reported on 05/08/2023), Disp: 30 tablet, Rfl: 5   Objective Vitals:   05/08/23 1323  BP: 119/72  Pulse: 84    Gen: NAD CV: S1 S2 RRR Lungs: Clear to auscultation bilaterally Abd: soft, nontender   Previous Pelvic Exam showed: POP-Q   3                                            Aa   5                                           Ba   5                                              C    6                                            Gh   3.5                                            Pb   6                                            tvl    3                                            Ap   4                                            Bp   -1  D       Assessment/ Plan  Assessment: The patient is a 68 y.o. year old scheduled to undergo Exam under anesthesia, robotic assisted total laparoscopic hysterectomy, bilateral salpingo-oophorectomy, sacrocolpopexy, perineorrhaphy, cystoscopy. Verbal consent was obtained for these procedures.  Plan: General Surgical Consent: The patient has previously been counseled on alternative treatments, and the decision by the patient and provider was to proceed with the procedure listed above.  For all procedures, there are risks of bleeding, infection, damage to surrounding organs including but not limited to bowel, bladder, blood vessels, ureters and nerves, and need for further surgery if an injury were to occur. These risks are all low with minimally invasive surgery.   There are risks of numbness and weakness at any body site or buttock/rectal pain.  It is possible that baseline pain can be worsened by surgery, either with or without mesh. If surgery is vaginal, there is also a low risk of  possible conversion to laparoscopy or open abdominal incision where indicated. Very rare risks include blood transfusion, blood clot, heart attack, pneumonia, or death.   There is also a risk of short-term postoperative urinary retention with need to use a catheter. About half of patients need to go home from surgery with a catheter, which is then later removed in the office. The risk of long-term need for a catheter is very low. There is also a risk of worsening of overactive bladder.     Prolapse (with or without mesh): Risk factors for surgical failure  include things that put pressure on your pelvis and the surgical repair, including obesity, chronic cough, and heavy lifting or straining (including lifting children or adults, straining on the toilet, or lifting heavy objects such as furniture or anything weighing >25 lbs. Risks of recurrence is 20-30% with vaginal native tissue repair and a less than 10% with sacrocolpopexy with mesh.    Sacrocolpopexy: Mesh implants may provide more prolapse support, but do have some unique risks to consider. It is important to understand that mesh is permanent and cannot be easily removed. Risks of abdominal sacrocolpopexy mesh include mesh exposure (~3-6%), painful intercourse (recent studies show lower rates after surgery compared to before, with ~5-8% risk of new onset), and very rare risks of bowel or bladder injury or infection (<1%). The risk of mesh exposure is more likely in a woman with risks for poor healing (prior radiation, poorly controlled diabetes, or immunocompromised). The risk of new or worsened chronic pain after mesh implant is more common in women with baseline chronic pain and/or poorly controlled anxiety or depression. There is an FDA safety notification on vaginal mesh procedures for prolapse but NOT abdominal mesh procedures and therefore does not apply to your surgery. We have extensive experience and training with mesh placement and we have  close postoperative follow up to identify any potential complications from mesh.    We discussed consent for blood products. Risks for blood transfusion include allergic reactions, other reactions that can affect different body organs and managed accordingly, transmission of infectious diseases such as HIV or Hepatitis. However, the blood is screened. Patient consents for blood products.  Pre-operative instructions:  She was instructed to not take Aspirin/NSAIDs x 7days prior to surgery.  Antibiotic prophylaxis was ordered as indicated.  Catheter use: Patient will go home with foley if needed after post-operative voiding trial.  Post-operative instructions:  She was provided with specific post-operative instructions, including precautions and signs/symptoms for which we would recommend contacting us, in addition  to daytime and after-hours contact phone numbers. This was provided on a handout.   Post-operative medications: Prescriptions for motrin, tylenol, miralax, and oxycodone were sent to her pharmacy. Discussed using ibuprofen and tylenol on a schedule to limit use of narcotics.   Laboratory testing:  We will check labs: CBC and type and screen  Preoperative clearance:  She does require surgical clearance.  Obtained from Cardiology  Post-operative follow-up:  A post-operative appointment will be made for 6 weeks from the date of surgery. If she needs a post-operative nurse visit for a voiding trial, that will be set up after she leaves the hospital.    Patient will call the clinic or use MyChart should anything change or any new issues arise.   Marguerita Beards, MD  Time spent: I spent 25 minutes dedicated to the care of this patient on the date of this encounter to include pre-visit review of records, face-to-face time with the patient and post visit documentation and ordering medication/ testing.

## 2023-05-11 ENCOUNTER — Encounter: Payer: Self-pay | Admitting: Obstetrics and Gynecology

## 2023-05-11 MED ORDER — POLYETHYLENE GLYCOL 3350 17 GM/SCOOP PO POWD: 17.0000 g | Freq: Every day | ORAL | 0 refills | Status: DC

## 2023-05-11 MED ORDER — ESTRADIOL 0.1 MG/GM VA CREA: TOPICAL_CREAM | VAGINAL | 11 refills | Status: AC

## 2023-05-11 MED ORDER — OXYCODONE HCL 5 MG PO TABS: 5.0000 mg | ORAL_TABLET | ORAL | 0 refills | Status: DC | PRN

## 2023-05-11 MED ORDER — ACETAMINOPHEN 500 MG PO TABS: 500.0000 mg | ORAL_TABLET | Freq: Four times a day (QID) | ORAL | 0 refills | Status: AC | PRN

## 2023-05-11 MED ORDER — IBUPROFEN 600 MG PO TABS: 600.0000 mg | ORAL_TABLET | Freq: Four times a day (QID) | ORAL | 0 refills | Status: AC | PRN

## 2023-06-02 NOTE — H&P (Signed)
 [facility] Pre Op H&P  Subjective Chief Complaint: Tiffany Powers presents for a evaluation of prolapse  History of Present Illness: Tiffany Powers is a 68 y.o. female. She is scheduled to undergo Exam under anesthesia, robotic assisted total laparoscopic hysterectomy, bilateral salpingo-oophorectomy, sacrocolpopexy, perineorrhaphy, cystoscopy on 06/08/23.  Her symptoms include vaginal bulge, and she was was found to have Stage IV anterior, Stage IV posterior, Stage IV apical prolapse.   Urodynamic Impression:  1. Sensation was increased; capacity was low-normal 2. Stress Incontinence was not demonstrated at normal pressures; 3. Detrusor Overactivity was demonstrated without leakage. 4. Emptying was normal with a normal PVR, a sustained detrusor contraction present,  abdominal straining not present, normal urethral sphincter activity on EMG.  Past Medical History:  Diagnosis Date   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   Carpal tunnel syndrome of right wrist 10/19/2019   Controlled type 2 diabetes mellitus without complication, without long-term current use of insulin (HCC) 12/31/2017   Coronary artery disease cardiac catheterization 2017 showing 99% stenosis of small intermediate branch, up to 30% proximal LAD 07/01/2021   Diabetes mellitus without complication (HCC)    High cholesterol    Hyperlipidemia 07/30/2016   Hypertension    MI (myocardial infarction) (HCC) 2001   NSTEMI (non-ST elevated myocardial infarction) (HCC) 12/07/2015   Pure hypercholesterolemia 02/19/2017     Past Surgical History:  Procedure Laterality Date   CARDIAC CATHETERIZATION  2001; 2002; 12/06/2105   CARDIAC CATHETERIZATION N/A 12/07/2015   Procedure: Left Heart Cath and Coronary Angiography;  Surgeon: Yates Decamp, MD;  Location: [facility] INVASIVE CV LAB;  Service: Cardiovascular;  Laterality: N/A;   TUBAL LIGATION  1980s    has no known allergies.   Family History  Problem Relation Age of Onset   CAD  Father 74       MI   CAD Brother 59       MI   CAD Maternal Grandmother 67       CABG    Social History   Tobacco Use   Smoking status: Never    Passive exposure: Never   Smokeless tobacco: Never  Vaping Use   Vaping status: Never Used  Substance Use Topics   Alcohol use: Yes    Alcohol/week: 1.0 standard drink of alcohol    Types: 1 Glasses of wine per week    Comment: occasionally   Drug use: No     Review of Systems was negative for a full 10 system review except as noted in the History of Present Illness.  No current facility-administered medications for this encounter.  Current Outpatient Medications:    acetaminophen (TYLENOL) 500 MG tablet, Take 1 tablet (500 mg total) by mouth every 6 (six) hours as needed (pain). (Patient taking differently: Take 500-1,000 mg by mouth every 6 (six) hours as needed for moderate pain (pain score 4-6) (pain).), Disp: 30 tablet, Rfl: 0   Ascorbic Acid (VITAMIN C PO), Take 1 tablet by mouth daily., Disp: , Rfl:    clopidogrel (PLAVIX) 75 MG tablet, Take 1 tablet (75 mg total) by mouth daily., Disp: 90 tablet, Rfl: 3   Cyanocobalamin (VITAMIN B-12 PO), Take 1 tablet by mouth daily., Disp: , Rfl:    ezetimibe (ZETIA) 10 MG tablet, Take 1 tablet (10 mg total) by mouth daily., Disp: 90 tablet, Rfl: 3   hydrochlorothiazide (HYDRODIURIL) 25 MG tablet, Take 1 tablet (25 mg total) by mouth daily., Disp: 90 tablet, Rfl: 3   nitroGLYCERIN (NITROSTAT) 0.4 MG SL tablet, Place  1 tablet (0.4 mg total) under the tongue every 5 (five) minutes as needed for chest pain., Disp: 25 tablet, Rfl: 1   rosuvastatin (CRESTOR) 40 MG tablet, Take 1 tablet (40 mg total) by mouth daily., Disp: 90 tablet, Rfl: 3   telmisartan (MICARDIS) 40 MG tablet, Take 1 tablet (40 mg total) by mouth daily., Disp: 90 tablet, Rfl: 3   VITAMIN D PO, Take 1 capsule by mouth daily., Disp: , Rfl:    estradiol (ESTRACE) 0.1 MG/GM vaginal cream, Place 0.5g twice a week at night, Disp: 30  g, Rfl: 11   ibuprofen (ADVIL) 600 MG tablet, Take 1 tablet (600 mg total) by mouth every 6 (six) hours as needed., Disp: 30 tablet, Rfl: 0   oxyCODONE (OXY IR/ROXICODONE) 5 MG immediate release tablet, Take 1 tablet (5 mg total) by mouth every 4 (four) hours as needed for severe pain (pain score 7-10)., Disp: 15 tablet, Rfl: 0   polyethylene glycol powder (GLYCOLAX/MIRALAX) 17 GM/SCOOP powder, Take 17 g by mouth daily. Drink 17g (1 scoop) dissolved in water per day., Disp: 255 g, Rfl: 0   Objective There were no vitals filed for this visit.   Gen: NAD CV: S1 S2 RRR Lungs: Clear to auscultation bilaterally Abd: soft, nontender   Previous Pelvic Exam showed: POP-Q   3                                            Aa   5                                           Ba   5                                              C    6                                            Gh   3.5                                            Pb   6                                            tvl    3                                            Ap   4  Bp   -1                                              D       Assessment/ Plan  The patient is a 68 y.o. year old with stage IV POP scheduled to undergo Exam under anesthesia, robotic assisted total laparoscopic hysterectomy, bilateral salpingo-oophorectomy, sacrocolpopexy, perineorrhaphy, cystoscopy.    Marguerita Beards, MD

## 2023-06-02 NOTE — Pre-Procedure Instructions (Signed)
 Surgical Instructions   Your procedure is scheduled on June 08, 2023. Report to [facility] Main Entrance "A" at 9:30 A.M., then check in with the Admitting office. Any questions or running late day of surgery: call [phone]  Questions prior to your surgery date: call [phone], Monday-Friday, 8am-4pm. If you experience any cold or flu symptoms such as cough, fever, chills, shortness of breath, etc. between now and your scheduled surgery, please notify us at the above number.     Remember:  Do not eat after midnight the night before your surgery   You may drink clear liquids until 8:30 AM the morning of your surgery.   Clear liquids allowed are: Water, Non-Citrus Juices (without pulp), Carbonated Beverages, Clear Tea (no milk, honey, etc.), Black Coffee Only (NO MILK, CREAM OR POWDERED CREAMER of any kind), and Gatorade.    Take these medicines the morning of surgery with A SIP OF WATER: ezetimibe (ZETIA)  rosuvastatin (CRESTOR)    May take these medicines IF NEEDED: acetaminophen (TYLENOL)  nitroGLYCERIN (NITROSTAT) - if dose taken prior to surgery, please call either of the above phone numbers oxyCODONE (OXY IR/ROXICODONE)    Follow your surgeon's instructions on when to stop clopidogrel (PLAVIX).  If no instructions were given by your surgeon then you will need to call the office to get those instructions.     One week prior to surgery, STOP taking any Aspirin (unless otherwise instructed by your surgeon) Aleve, Naproxen, Ibuprofen, Motrin, Advil, Goody's, BC's, all herbal medications, fish oil, and non-prescription vitamins.   HOW TO MANAGE YOUR DIABETES BEFORE AND AFTER SURGERY  Why is it important to control my blood sugar before and after surgery? Improving blood sugar levels before and after surgery helps healing and can limit problems. A way of improving blood sugar control is eating a healthy diet by:  Eating less sugar and carbohydrates  Increasing  activity/exercise  Talking with your doctor about reaching your blood sugar goals High blood sugars (greater than 180 mg/dL) can raise your risk of infections and slow your recovery, so you will need to focus on controlling your diabetes during the weeks before surgery. Make sure that the doctor who takes care of your diabetes knows about your planned surgery including the date and location.  How do I manage my blood sugar before surgery? Check your blood sugar at least 4 times a day, starting 2 days before surgery, to make sure that the level is not too high or low.  Check your blood sugar the morning of your surgery when you wake up and every 2 hours until you get to the Short Stay unit.  If your blood sugar is less than 70 mg/dL, you will need to treat for low blood sugar: Do not take insulin. Treat a low blood sugar (less than 70 mg/dL) with  cup of clear juice (cranberry or apple), 4 glucose tablets, OR glucose gel. Recheck blood sugar in 15 minutes after treatment (to make sure it is greater than 70 mg/dL). If your blood sugar is not greater than 70 mg/dL on recheck, call [phone] for further instructions. Report your blood sugar to the short stay nurse when you get to Short Stay.  If you are admitted to the hospital after surgery: Your blood sugar will be checked by the staff and you will probably be given insulin after surgery (instead of oral diabetes medicines) to make sure you have good blood sugar levels. The goal for blood sugar control after surgery is  80-180 mg/dL.                      Do NOT Smoke (Tobacco/Vaping) for 24 hours prior to your procedure.  If you use a CPAP at night, you may bring your mask/headgear for your overnight stay.   You will be asked to remove any contacts, glasses, piercing's, hearing aid's, dentures/partials prior to surgery. Please bring cases for these items if needed.    Patients discharged the day of surgery will not be allowed to drive  home, and someone needs to stay with them for 24 hours.  SURGICAL WAITING ROOM VISITATION Patients may have no more than 2 support people in the waiting area - these visitors may rotate.   Pre-op nurse will coordinate an appropriate time for 1 ADULT support person, who may not rotate, to accompany patient in pre-op.  Children under the age of 33 must have an adult with them who is not the patient and must remain in the main waiting area with an adult.  If the patient needs to stay at the hospital during part of their recovery, the visitor guidelines for inpatient rooms apply.  Please refer to the [facility] website for the visitor guidelines for any additional information.   If you received a COVID test during your pre-op visit  it is requested that you wear a mask when out in public, stay away from anyone that may not be feeling well and notify your surgeon if you develop symptoms. If you have been in contact with anyone that has tested positive in the last 10 days please notify you surgeon.      Pre-operative CHG Bathing Instructions   You can play a key role in reducing the risk of infection after surgery. Your skin needs to be as free of germs as possible. You can reduce the number of germs on your skin by washing with CHG (chlorhexidine gluconate) soap before surgery. CHG is an antiseptic soap that kills germs and continues to kill germs even after washing.   DO NOT use if you have an allergy to chlorhexidine/CHG or antibacterial soaps. If your skin becomes reddened or irritated, stop using the CHG and notify one of our RNs at [phone].              TAKE A SHOWER THE NIGHT BEFORE SURGERY AND THE DAY OF SURGERY    Please keep in mind the following:  DO NOT shave, including legs and underarms, 48 hours prior to surgery.   You may shave your face before/day of surgery.  Place clean sheets on your bed the night before surgery Use a clean washcloth (not used since being washed) for  each shower. DO NOT sleep with pet's night before surgery.  CHG Shower Instructions:  Wash your face and private area with normal soap. If you choose to wash your hair, wash first with your normal shampoo.  After you use shampoo/soap, rinse your hair and body thoroughly to remove shampoo/soap residue.  Turn the water OFF and apply half the bottle of CHG soap to a CLEAN washcloth.  Apply CHG soap ONLY FROM YOUR NECK DOWN TO YOUR TOES (washing for 3-5 minutes)  DO NOT use CHG soap on face, private areas, open wounds, or sores.  Pay special attention to the area where your surgery is being performed.  If you are having back surgery, having someone wash your back for you may be helpful. Wait 2 minutes after CHG soap  is applied, then you may rinse off the CHG soap.  Pat dry with a clean towel  Put on clean pajamas    Additional instructions for the day of surgery: DO NOT APPLY any lotions, deodorants, cologne, or perfumes.   Do not wear jewelry or makeup Do not wear nail polish, gel polish, artificial nails, or any other type of covering on natural nails (fingers and toes) Do not bring valuables to the hospital. [facility] is not responsible for valuables/personal belongings. Put on clean/comfortable clothes.  Please brush your teeth.  Ask your nurse before applying any prescription medications to the skin.

## 2023-06-03 ENCOUNTER — Encounter (HOSPITAL_COMMUNITY): Payer: Self-pay

## 2023-06-03 ENCOUNTER — Encounter (HOSPITAL_COMMUNITY)
Admission: RE | Admit: 2023-06-03 | Discharge: 2023-06-03 | Disposition: A | Discharge status: Home / Self Care | Payer: Medicare Other | Source: Ambulatory Visit | Attending: Obstetrics and Gynecology | Admitting: Obstetrics and Gynecology

## 2023-06-03 ENCOUNTER — Other Ambulatory Visit: Payer: Self-pay

## 2023-06-03 DIAGNOSIS — E119 Type 2 diabetes mellitus without complications: Secondary | ICD-10-CM | POA: Diagnosis not present

## 2023-06-03 DIAGNOSIS — Z01818 Encounter for other preprocedural examination: Secondary | ICD-10-CM | POA: Diagnosis not present

## 2023-06-03 HISTORY — DX: Unspecified osteoarthritis, unspecified site: M19.90

## 2023-06-03 LAB — CBC
HCT: 38.1 % (ref 36.0–46.0)
Hemoglobin: 12.8 g/dL (ref 12.0–15.0)
MCH: 29.7 pg (ref 26.0–34.0)
MCHC: 33.6 g/dL (ref 30.0–36.0)
MCV: 88.4 fL (ref 80.0–100.0)
Platelets: 268 10*3/uL (ref 150–400)
RBC: 4.31 MIL/uL (ref 3.87–5.11)
RDW: 13.2 % (ref 11.5–15.5)
WBC: 6 10*3/uL (ref 4.0–10.5)
nRBC: 0 % (ref 0.0–0.2)

## 2023-06-03 LAB — BASIC METABOLIC PANEL
Anion gap: 9 (ref 5–15)
BUN: 15 mg/dL (ref 8–23)
CO2: 28 mmol/L (ref 22–32)
Calcium: 9.9 mg/dL (ref 8.9–10.3)
Chloride: 103 mmol/L (ref 98–111)
Creatinine, Ser: 0.95 mg/dL (ref 0.44–1.00)
GFR, Estimated: >60 mL/min (ref >60)
Glucose, Bld: 95 mg/dL (ref 70–99)
Potassium: 3.9 mmol/L (ref 3.5–5.1)
Sodium: 140 mmol/L (ref 135–145)

## 2023-06-03 LAB — TYPE AND SCREEN
ABO/RH(D): AB POS
Antibody Screen: NEGATIVE

## 2023-06-03 LAB — HEMOGLOBIN A1C
Hgb A1c MFr Bld: 6.2 % — ABNORMAL HIGH (ref 4.8–5.6)
Mean Plasma Glucose: 131.24 mg/dL

## 2023-06-03 NOTE — Progress Notes (Signed)
 PCP - Dr. Shanda Bumps Copland Cardiologist - Dr. Gypsy Balsam - Last office visit 01/26/2023  PPM/ICD - Denies Device Orders - n/a Rep Notified - n/a  Chest x-ray - n/a EKG - 01/26/2023 Stress Test - Per pt, 15+ years ago. Result normal ECHO - 07/11/2021 Cardiac Cath - 12/07/2015  Sleep Study - Denies CPAP - n/a  Pt is DM2 but she says she is Pre-DM and does not take any medication. She checks her blood sugars 3-4x/month. Normal fasting level is 110s. A1c result pending.  Last dose of GLP1 agonist- n/a GLP1 instructions: n/a  Blood Thinner Instructions: Pt instructed to hold Plavix for 7 days. Last dose was February 15th. Aspirin Instructions: n/a  ERAS Protcol - Clear liquids until 0830 morning of surgery PRE-SURGERY Ensure or G2- n/a  COVID TEST- n/a   Anesthesia review: Yes. Cardiac Clearance   Patient denies shortness of breath, fever, cough and chest pain at PAT appointment. Pt denies any respiratory illness/infection in the last two months.    All instructions explained to the patient, with a verbal understanding of the material. Patient agrees to go over the instructions while at home for a better understanding. Patient also instructed to self quarantine after being tested for COVID-19. The opportunity to ask questions was provided.

## 2023-06-04 NOTE — Anesthesia Preprocedure Evaluation (Addendum)
 Anesthesia Evaluation  Patient identified by MRN, date of birth, ID band Patient awake    Reviewed: Allergy & Precautions, H&P , NPO status , Patient's Chart, lab work & pertinent test results  Airway Mallampati: II   Neck ROM: full    Dental   Pulmonary neg pulmonary ROS   breath sounds clear to auscultation       Cardiovascular hypertension, + CAD   Rhythm:regular Rate:Normal     Neuro/Psych  Neuromuscular disease    GI/Hepatic   Endo/Other  diabetes, Type 2    Renal/GU      Musculoskeletal  (+) Arthritis ,    Abdominal   Peds  Hematology   Anesthesia Other Findings   Reproductive/Obstetrics                             Anesthesia Physical Anesthesia Plan  ASA: 3  Anesthesia Plan: General   Post-op Pain Management:    Induction: Intravenous  PONV Risk Score and Plan: 3 and Ondansetron, Dexamethasone and Treatment may vary due to age or medical condition  Airway Management Planned: Oral ETT  Additional Equipment:   Intra-op Plan:   Post-operative Plan: Extubation in OR  Informed Consent: I have reviewed the patients History and Physical, chart, labs and discussed the procedure including the risks, benefits and alternatives for the proposed anesthesia with the patient or authorized representative who has indicated his/her understanding and acceptance.     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 Reported exercising 6 days per week without issue. Upcoming surgery was also discussed. Per note, "upcoming  urogynecological procedure. According to the Revised Cardiac Risk Index (RCRI), her Perioperative Risk of Major Cardiac Event is (%): 0.9 Her Functional Capacity in METs is: 7.59 according to the Duke Activity Status Index (DASI). Therefore, based on ACC/AHA guidelines, patient would be at acceptable risk for the planned procedure without further cardiovascular testing.  Regarding her Plavix, this can be held for 5 days and resumed once hemostasis has occurred at the discretion of the surgeon.  I will route this recommendation to the requesting party via Epic fax function."  Pt reports LD Plavix 05/30/23.  Preop labs reviewed, WNL. Diet controlled DM2 with A1c 6.2.  EKG 01/26/23: Normal sinus rhythm with sinus arrhythmia. Rate 64.  TTE 07/11/21: 1. Left ventricular ejection fraction, by estimation, is 55 to 60%. The  left ventricle has normal function. The left ventricle has no regional  wall motion abnormalities. Left ventricular diastolic parameters are  consistent with Grade I diastolic  dysfunction (impaired relaxation).  2. Right ventricular systolic function is normal. The right ventricular  size is normal. There is normal pulmonary artery systolic pressure.  3. The mitral valve is normal in structure. Mild mitral valve  regurgitation. No evidence of mitral stenosis.  4. The aortic valve is normal in structure. Aortic valve regurgitation is  not visualized. No aortic stenosis is present.  5. The inferior vena cava is normal in size with greater than 50%  respiratory variability, suggesting right atrial pressure of 3 mmHg.   Cath 12/07/2015: Normal  LV systolic function, no wall motion of normality. EF 55-60%. Normal coronary arteries except for ostial LAD 22 at most 30% stenosis, otherwise smooth and normal. A very small ramus intermediate which measures less than 0.5 mm and has very small area of distribution has a hazy 99% stenosis, probably the culprit lesion. RCA dominant and  smooth and normal, circumflex normal.  Rec: Medical therapy with aggressive risk factor reduction.   Disposition: Will be discharged home tomorrow with outpatient follow up.  )        Anesthesia Quick Evaluation

## 2023-06-04 NOTE — Progress Notes (Signed)
 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 Reported exercising 6 days per week without issue. Upcoming surgery was also discussed. Per note, "upcoming urogynecological procedure. According to the Revised Cardiac Risk Index (RCRI), her Perioperative Risk of Major Cardiac Event is (%): 0.9 Her Functional Capacity in METs is: 7.59 according to the Duke Activity Status Index (DASI). Therefore, based on ACC/AHA guidelines, patient would be at acceptable risk for the planned procedure without further cardiovascular testing.  Regarding her Plavix, this can be held for 5 days and resumed once hemostasis has occurred at the discretion of the surgeon.  I will route this recommendation to the requesting party via Epic fax function."  Pt reports LD Plavix 05/30/23.  Preop labs reviewed, WNL. Diet controlled DM2 with A1c 6.2.  EKG 01/26/23: Normal sinus rhythm with sinus arrhythmia. Rate 64.  TTE 07/11/21: 1. Left ventricular ejection fraction, by estimation, is 55 to 60%. The  left ventricle has normal function. The left ventricle has no regional  wall motion abnormalities. Left ventricular diastolic parameters are  consistent with Grade I diastolic  dysfunction (impaired relaxation).   2. Right ventricular systolic function is normal. The right ventricular  size is normal. There is normal pulmonary artery systolic pressure.   3. The mitral valve is normal in structure. Mild mitral valve  regurgitation. No evidence of mitral stenosis.   4. The aortic valve is normal in structure. Aortic valve regurgitation is  not visualized. No aortic stenosis is present.   5. The inferior vena cava is normal in size with greater than 50%  respiratory variability,  suggesting right atrial pressure of 3 mmHg.   Cath 12/07/2015: Normal LV systolic function, no wall motion of normality. EF 55-60%. Normal coronary arteries except for ostial LAD 22 at most 30% stenosis, otherwise smooth and normal. A very small ramus intermediate which measures less than 0.5 mm and has very small area of distribution has a hazy 99% stenosis, probably the culprit lesion. RCA dominant and smooth and normal, circumflex normal.   Rec: Medical therapy with aggressive risk factor reduction.    Disposition: Will be discharged home tomorrow with outpatient follow up.   [facility] Short Stay Center/Anesthesiology Phone [phone] 06/04/2023 11:32 AM

## 2023-06-08 ENCOUNTER — Ambulatory Visit (HOSPITAL_BASED_OUTPATIENT_CLINIC_OR_DEPARTMENT_OTHER): Payer: Medicare Other | Admitting: Anesthesiology

## 2023-06-08 ENCOUNTER — Other Ambulatory Visit: Payer: Self-pay

## 2023-06-08 ENCOUNTER — Ambulatory Visit (HOSPITAL_COMMUNITY)
Admission: RE | Admit: 2023-06-08 | Discharge: 2023-06-08 | Disposition: A | Discharge status: Home / Self Care | Payer: Medicare Other | Attending: Obstetrics and Gynecology | Admitting: Obstetrics and Gynecology

## 2023-06-08 ENCOUNTER — Ambulatory Visit (HOSPITAL_COMMUNITY): Payer: Medicare Other | Admitting: Physician Assistant

## 2023-06-08 ENCOUNTER — Encounter (HOSPITAL_COMMUNITY)
Admission: RE | Disposition: A | Discharge status: Home / Self Care | Payer: Self-pay | Source: Home / Self Care | Attending: Obstetrics and Gynecology

## 2023-06-08 ENCOUNTER — Encounter (HOSPITAL_COMMUNITY): Payer: Self-pay | Admitting: Obstetrics and Gynecology

## 2023-06-08 DIAGNOSIS — K660 Peritoneal adhesions (postprocedural) (postinfection): Secondary | ICD-10-CM | POA: Diagnosis not present

## 2023-06-08 DIAGNOSIS — N813 Complete uterovaginal prolapse: Secondary | ICD-10-CM | POA: Insufficient documentation

## 2023-06-08 DIAGNOSIS — N814 Uterovaginal prolapse, unspecified: Secondary | ICD-10-CM

## 2023-06-08 DIAGNOSIS — E119 Type 2 diabetes mellitus without complications: Secondary | ICD-10-CM | POA: Insufficient documentation

## 2023-06-08 DIAGNOSIS — Z01818 Encounter for other preprocedural examination: Secondary | ICD-10-CM

## 2023-06-08 DIAGNOSIS — I10 Essential (primary) hypertension: Secondary | ICD-10-CM | POA: Diagnosis not present

## 2023-06-08 DIAGNOSIS — N393 Stress incontinence (female) (male): Secondary | ICD-10-CM | POA: Diagnosis not present

## 2023-06-08 DIAGNOSIS — I2510 Atherosclerotic heart disease of native coronary artery without angina pectoris: Secondary | ICD-10-CM | POA: Diagnosis not present

## 2023-06-08 HISTORY — PX: XI ROBOTIC ASSISTED TOTAL HYSTERECTOMY WITH SACROCOLPOPEXY: SHX6825

## 2023-06-08 HISTORY — PX: ROBOTIC ASSISTED LAPAROSCOPIC LYSIS OF ADHESION: SHX6080

## 2023-06-08 HISTORY — PX: RECTOCELE REPAIR: SHX761

## 2023-06-08 HISTORY — PX: CYSTOSCOPY: SHX5120

## 2023-06-08 LAB — GLUCOSE, CAPILLARY: Glucose-Capillary: 153 mg/dL — ABNORMAL HIGH (ref 70–99)

## 2023-06-08 LAB — ABO/RH: ABO/RH(D): AB POS

## 2023-06-08 SURGERY — HYSTERECTOMY, TOTAL, ROBOT-ASSISTED, WITH SACROCOLPOPEXY
Anesthesia: General | Site: Vagina

## 2023-06-08 MED ORDER — METRONIDAZOLE 500 MG/100ML IV SOLN
500.0000 mg | INTRAVENOUS | Status: AC
  Administered 2023-06-08: 500 mg via INTRAVENOUS
  Filled 2023-06-08: qty 100

## 2023-06-08 MED ORDER — OXYCODONE HCL 5 MG PO TABS: 5.0000 mg | ORAL_TABLET | Freq: Once | ORAL | Status: DC | PRN

## 2023-06-08 MED ORDER — ROCURONIUM BROMIDE 10 MG/ML (PF) SYRINGE
PREFILLED_SYRINGE | INTRAVENOUS | Status: DC | PRN
  Administered 2023-06-08 (×3): 10 mg via INTRAVENOUS
  Administered 2023-06-08: 50 mg via INTRAVENOUS

## 2023-06-08 MED ORDER — ORAL CARE MOUTH RINSE: 15.0000 mL | Freq: Once | OROMUCOSAL | Status: AC

## 2023-06-08 MED ORDER — LIDOCAINE-EPINEPHRINE 1 %-1:100000 IJ SOLN
INTRAMUSCULAR | Status: DC | PRN
  Administered 2023-06-08: 15 mL

## 2023-06-08 MED ORDER — SUGAMMADEX SODIUM 200 MG/2ML IV SOLN
INTRAVENOUS | Status: DC | PRN
  Administered 2023-06-08: 200 mg via INTRAVENOUS

## 2023-06-08 MED ORDER — PROPOFOL 10 MG/ML IV BOLUS
INTRAVENOUS | Status: AC
  Filled 2023-06-08: qty 20

## 2023-06-08 MED ORDER — ONDANSETRON HCL 4 MG/2ML IJ SOLN: 4.0000 mg | Freq: Four times a day (QID) | INTRAMUSCULAR | Status: DC | PRN

## 2023-06-08 MED ORDER — LIDOCAINE 2% (20 MG/ML) 5 ML SYRINGE
INTRAMUSCULAR | Status: AC
  Filled 2023-06-08: qty 5

## 2023-06-08 MED ORDER — ONDANSETRON HCL 4 MG/2ML IJ SOLN
INTRAMUSCULAR | Status: DC | PRN
  Administered 2023-06-08: 4 mg via INTRAVENOUS

## 2023-06-08 MED ORDER — LACTATED RINGERS IV SOLN: INTRAVENOUS | Status: DC

## 2023-06-08 MED ORDER — OXYCODONE HCL 5 MG/5ML PO SOLN: 5.0000 mg | Freq: Once | ORAL | Status: DC | PRN

## 2023-06-08 MED ORDER — STERILE WATER FOR IRRIGATION IR SOLN
Status: DC | PRN
  Administered 2023-06-08: 1000 mL

## 2023-06-08 MED ORDER — LIDOCAINE HCL 1 % IJ SOLN: INTRAMUSCULAR | Status: DC | PRN

## 2023-06-08 MED ORDER — POVIDONE-IODINE 10 % EX SWAB
2.0000 | Freq: Once | CUTANEOUS | Status: AC
  Administered 2023-06-08: 2 via TOPICAL

## 2023-06-08 MED ORDER — PHENAZOPYRIDINE HCL 200 MG PO TABS
200.0000 mg | ORAL_TABLET | ORAL | Status: AC
  Administered 2023-06-08: 200 mg via ORAL
  Filled 2023-06-08: qty 1

## 2023-06-08 MED ORDER — ACETAMINOPHEN 500 MG PO TABS
1000.0000 mg | ORAL_TABLET | ORAL | Status: AC
  Administered 2023-06-08: 1000 mg via ORAL
  Filled 2023-06-08: qty 2

## 2023-06-08 MED ORDER — FENTANYL CITRATE (PF) 250 MCG/5ML IJ SOLN
INTRAMUSCULAR | Status: AC
  Filled 2023-06-08: qty 5

## 2023-06-08 MED ORDER — CHLORHEXIDINE GLUCONATE 0.12 % MT SOLN
15.0000 mL | Freq: Once | OROMUCOSAL | Status: AC
  Administered 2023-06-08: 15 mL via OROMUCOSAL
  Filled 2023-06-08: qty 15

## 2023-06-08 MED ORDER — OXYCODONE HCL 5 MG PO TABS: 5.0000 mg | ORAL_TABLET | ORAL | Status: DC | PRN

## 2023-06-08 MED ORDER — ROCURONIUM BROMIDE 10 MG/ML (PF) SYRINGE
PREFILLED_SYRINGE | INTRAVENOUS | Status: AC
  Filled 2023-06-08: qty 10

## 2023-06-08 MED ORDER — KETOROLAC TROMETHAMINE 30 MG/ML IJ SOLN
INTRAMUSCULAR | Status: DC | PRN
  Administered 2023-06-08: 30 mg via INTRAVENOUS

## 2023-06-08 MED ORDER — ENOXAPARIN SODIUM 40 MG/0.4ML IJ SOSY
40.0000 mg | PREFILLED_SYRINGE | INTRAMUSCULAR | Status: AC
  Administered 2023-06-08: 40 mg via SUBCUTANEOUS
  Filled 2023-06-08: qty 0.4

## 2023-06-08 MED ORDER — PHENYLEPHRINE 80 MCG/ML (10ML) SYRINGE FOR IV PUSH (FOR BLOOD PRESSURE SUPPORT)
PREFILLED_SYRINGE | INTRAVENOUS | Status: DC | PRN
  Administered 2023-06-08: 80 ug via INTRAVENOUS

## 2023-06-08 MED ORDER — IBUPROFEN 600 MG PO TABS
600.0000 mg | ORAL_TABLET | Freq: Four times a day (QID) | ORAL | Status: DC
Start: 2023-06-08 — End: 2023-06-11

## 2023-06-08 MED ORDER — DEXMEDETOMIDINE HCL IN NACL 80 MCG/20ML IV SOLN
INTRAVENOUS | Status: DC | PRN
  Administered 2023-06-08: 8 ug via INTRAVENOUS

## 2023-06-08 MED ORDER — EPHEDRINE SULFATE (PRESSORS) 50 MG/ML IJ SOLN
INTRAMUSCULAR | Status: DC | PRN
  Administered 2023-06-08 (×2): 5 mg via INTRAVENOUS
  Administered 2023-06-08: 20 mg via INTRAVENOUS

## 2023-06-08 MED ORDER — ACETAMINOPHEN 325 MG PO TABS: 650.0000 mg | ORAL_TABLET | ORAL | Status: DC | PRN

## 2023-06-08 MED ORDER — PHENYLEPHRINE 80 MCG/ML (10ML) SYRINGE FOR IV PUSH (FOR BLOOD PRESSURE SUPPORT)
PREFILLED_SYRINGE | INTRAVENOUS | Status: AC
  Filled 2023-06-08: qty 10

## 2023-06-08 MED ORDER — ONDANSETRON HCL 4 MG/2ML IJ SOLN
INTRAMUSCULAR | Status: AC
Start: 2023-06-08 — End: ?
  Filled 2023-06-08: qty 2

## 2023-06-08 MED ORDER — FENTANYL CITRATE (PF) 100 MCG/2ML IJ SOLN
25.0000 ug | INTRAMUSCULAR | Status: DC | PRN
  Administered 2023-06-08 (×2): 50 ug via INTRAVENOUS

## 2023-06-08 MED ORDER — FENTANYL CITRATE (PF) 100 MCG/2ML IJ SOLN
INTRAMUSCULAR | Status: DC | PRN
  Administered 2023-06-08 (×3): 50 ug via INTRAVENOUS
  Administered 2023-06-08: 25 ug via INTRAVENOUS
  Administered 2023-06-08: 50 ug via INTRAVENOUS
  Administered 2023-06-08: 25 ug via INTRAVENOUS

## 2023-06-08 MED ORDER — ONDANSETRON HCL 4 MG PO TABS
4.0000 mg | ORAL_TABLET | Freq: Four times a day (QID) | ORAL | Status: DC | PRN
  Filled 2023-06-08: qty 1

## 2023-06-08 MED ORDER — PROPOFOL 10 MG/ML IV BOLUS
INTRAVENOUS | Status: DC | PRN
  Administered 2023-06-08: 130 mg via INTRAVENOUS

## 2023-06-08 MED ORDER — FENTANYL CITRATE (PF) 100 MCG/2ML IJ SOLN
INTRAMUSCULAR | Status: AC
  Filled 2023-06-08: qty 2

## 2023-06-08 MED ORDER — DEXAMETHASONE SODIUM PHOSPHATE 10 MG/ML IJ SOLN
INTRAMUSCULAR | Status: DC | PRN
  Administered 2023-06-08: 5 mg via INTRAVENOUS

## 2023-06-08 MED ORDER — LIDOCAINE 2% (20 MG/ML) 5 ML SYRINGE
INTRAMUSCULAR | Status: DC | PRN
  Administered 2023-06-08: 60 mg via INTRAVENOUS

## 2023-06-08 MED ORDER — BUPIVACAINE HCL (PF) 0.25 % IJ SOLN
INTRAMUSCULAR | Status: DC | PRN
  Administered 2023-06-08: 26 mL

## 2023-06-08 MED ORDER — SIMETHICONE 80 MG PO CHEW: 80.0000 mg | CHEWABLE_TABLET | Freq: Four times a day (QID) | ORAL | Status: DC | PRN

## 2023-06-08 MED ORDER — CEFAZOLIN SODIUM-DEXTROSE 2-4 GM/100ML-% IV SOLN
2.0000 g | INTRAVENOUS | Status: AC
  Administered 2023-06-08 (×2): 2 g via INTRAVENOUS
  Filled 2023-06-08: qty 100

## 2023-06-08 MED ORDER — SODIUM CHLORIDE 0.9 % IR SOLN
Status: DC | PRN
  Administered 2023-06-08: 1000 mL via INTRAVESICAL
  Administered 2023-06-08: 1000 mL

## 2023-06-08 SURGICAL SUPPLY — 94 items
APPLICATOR SURGIFLO ENDO (HEMOSTASIS) IMPLANT
BLADE CLIPPER SENSICLIP SURGIC (BLADE) ×3 IMPLANT
BLADE SURG 15 STRL LF DISP TIS (BLADE) ×3 IMPLANT
CATH FOLEY LF 3WAY 5CC16FR (CATHETERS) IMPLANT
CHLORAPREP W/TINT 26 (MISCELLANEOUS) ×3 IMPLANT
CLSR STERI-STRIP ANTIMIC 1/2X4 (GAUZE/BANDAGES/DRESSINGS) IMPLANT
COVER BACK TABLE 60X90IN (DRAPES) ×3 IMPLANT
COVER TIP SHEARS 8 DVNC (MISCELLANEOUS) ×3 IMPLANT
DEFOGGER SCOPE WARMER CLEARIFY (MISCELLANEOUS) ×3 IMPLANT
DERMABOND ADVANCED .7 DNX12 (GAUZE/BANDAGES/DRESSINGS) ×3 IMPLANT
DEVICE CAPIO SLIM SINGLE (INSTRUMENTS) IMPLANT
DILATOR CANAL MILEX (MISCELLANEOUS) IMPLANT
DRAPE ARM DVNC X/XI (DISPOSABLE) ×12 IMPLANT
DRAPE COLUMN DVNC XI (DISPOSABLE) ×3 IMPLANT
DRAPE SHEET LG 3/4 BI-LAMINATE (DRAPES) ×3 IMPLANT
DRAPE UTILITY XL STRL (DRAPES) ×3 IMPLANT
DRIVER NDL LRG 8 DVNC XI (INSTRUMENTS) ×3 IMPLANT
DRIVER NDL MEGA SUTCUT DVNCXI (INSTRUMENTS) ×3 IMPLANT
DRIVER NDLE LRG 8 DVNC XI (INSTRUMENTS) ×3 IMPLANT
DRIVER NDLE MEGA SUTCUT DVNCXI (INSTRUMENTS) ×3 IMPLANT
ELECT REM PT RETURN 9FT ADLT (ELECTROSURGICAL) ×3 IMPLANT
ELECTRODE REM PT RTRN 9FT ADLT (ELECTROSURGICAL) ×3 IMPLANT
FORCEPS BPLR 8 MD DVNC XI (FORCEP) ×3 IMPLANT
FORCEPS TENACULUM DVNC XI (FORCEP) IMPLANT
GAUZE 4X4 16PLY ~~LOC~~+RFID DBL (SPONGE) ×3 IMPLANT
GAUZE STRIP PACKING 2INX5YD (MISCELLANEOUS) IMPLANT
GLOVE BIO SURGEON STRL SZ 6 (GLOVE) ×3 IMPLANT
GLOVE BIOGEL PI IND STRL 6 (GLOVE) IMPLANT
GLOVE BIOGEL PI IND STRL 6.5 (GLOVE) ×3 IMPLANT
GLOVE BIOGEL PI IND STRL 7.0 (GLOVE) ×3 IMPLANT
GLOVE ECLIPSE 6.0 STRL STRAW (GLOVE) ×3 IMPLANT
GLOVE ECLIPSE 7.0 STRL STRAW (GLOVE) IMPLANT
GLOVE SURG ENC MOIS LTX SZ6 (GLOVE) ×9 IMPLANT
GLOVE SURG SS PI 7.0 STRL IVOR (GLOVE) IMPLANT
GLOVE SURG SYN 6.5 ES PF (GLOVE) ×3 IMPLANT
GLOVE SURG SYN 6.5 PF PI (GLOVE) IMPLANT
GLOVE SURG UNDER POLY LF SZ6.5 (GLOVE) ×12 IMPLANT
GOWN STRL REUS W/TWL LRG LVL3 (GOWN DISPOSABLE) ×3 IMPLANT
GRASPER TIP-UP FEN DVNC XI (INSTRUMENTS) ×3 IMPLANT
HIBICLENS CHG 4% 4OZ BTL (MISCELLANEOUS) ×3 IMPLANT
HOLDER FOLEY CATH W/STRAP (MISCELLANEOUS) ×3 IMPLANT
IRRIG SUCT STRYKERFLOW 2 WTIP (MISCELLANEOUS) ×3 IMPLANT
IRRIGATION SUCT STRKRFLW 2 WTP (MISCELLANEOUS) ×3 IMPLANT
KIT PINK PAD W/HEAD ARE REST (MISCELLANEOUS) ×3 IMPLANT
KIT PINK PAD W/HEAD ARM REST (MISCELLANEOUS) ×3 IMPLANT
KIT TURNOVER CYSTO (KITS) ×3 IMPLANT
KIT TURNOVER KIT B (KITS) ×3 IMPLANT
LEGGING LITHOTOMY PAIR STRL (DRAPES) ×3 IMPLANT
MANIFOLD NEPTUNE II (INSTRUMENTS) ×3 IMPLANT
MESH VERTESSA LITE -Y 2X4X3 (Mesh General) ×3 IMPLANT
NDL HYPO 22X1.5 SAFETY MO (MISCELLANEOUS) ×3 IMPLANT
NDL INSUFFLATION 14GA 120MM (NEEDLE) ×3 IMPLANT
NEEDLE HYPO 22X1.5 SAFETY MO (MISCELLANEOUS) ×3 IMPLANT
NEEDLE INSUFFLATION 14GA 120MM (NEEDLE) ×3 IMPLANT
NS IRRIG 1000ML POUR BTL (IV SOLUTION) ×3 IMPLANT
OBTURATOR OPTICAL STND 8 DVNC (TROCAR) ×3 IMPLANT
OBTURATOR OPTICALSTD 8 DVNC (TROCAR) ×3 IMPLANT
PACK CYSTO (CUSTOM PROCEDURE TRAY) ×3 IMPLANT
PACK ROBOT WH (CUSTOM PROCEDURE TRAY) ×3 IMPLANT
PACK ROBOTIC GOWN (GOWN DISPOSABLE) ×3 IMPLANT
PAD OB MATERNITY 11 LF (PERSONAL CARE ITEMS) ×3 IMPLANT
PATTIES SURGICAL .5 X3 (DISPOSABLE) IMPLANT
POUCH SPECIMEN RETRIEVAL 10MM (ENDOMECHANICALS) IMPLANT
PROTECTOR NERVE ULNAR (MISCELLANEOUS) ×3 IMPLANT
RETRACTOR LONE STAR DISPOSABLE (INSTRUMENTS) ×3 IMPLANT
RETRACTOR STAY HOOK 5MM (MISCELLANEOUS) ×3 IMPLANT
SCISSORS LAP 5X35 DISP (ENDOMECHANICALS) IMPLANT
SCISSORS MNPLR CVD DVNC XI (INSTRUMENTS) ×3 IMPLANT
SEAL UNIV 5-12 XI (MISCELLANEOUS) ×15 IMPLANT
SEALER VESSEL EXT DVNC XI (MISCELLANEOUS) IMPLANT
SET IRRIG Y TYPE TUR BLADDER L (SET/KITS/TRAYS/PACK) ×3 IMPLANT
SET TUBE SMOKE EVAC HIGH FLOW (TUBING) ×3 IMPLANT
SLEEVE SCD COMPRESS KNEE MED (STOCKING) ×3 IMPLANT
SOL ELECTROSURG ANTI STICK (MISCELLANEOUS) ×3 IMPLANT
SOLUTION ELECTROSURG ANTI STCK (MISCELLANEOUS) IMPLANT
SPIKE FLUID TRANSFER (MISCELLANEOUS) ×3 IMPLANT
SUCTION TUBE FRAZIER 10FR DISP (SUCTIONS) ×3 IMPLANT
SURGIFLO W/THROMBIN 8M KIT (HEMOSTASIS) IMPLANT
SUT ABS MONO DBL WITH NDL 48IN (SUTURE) IMPLANT
SUT GORETEX NAB #0 THX26 36IN (SUTURE) ×6 IMPLANT
SUT MNCRL AB 4-0 PS2 18 (SUTURE) ×6 IMPLANT
SUT MON AB 2-0 SH 27 (SUTURE) ×3 IMPLANT
SUT PROLENE 2 0 CT2 30 (SUTURE) IMPLANT
SUT STRATA PDS 2-0 23 CT-1 (SUTURE) ×6 IMPLANT
SUT V-LOC BARB 180 2/0GR9 GS23 (SUTURE) ×12 IMPLANT
SUT VIC AB 0 CT1 27XBRD ANTBC (SUTURE) IMPLANT
SUT VIC AB 2-0 SH 27XBRD (SUTURE) ×3 IMPLANT
SUT VIC AB 3-0 SH 18 (SUTURE) IMPLANT
SUT VICRYL 0 UR6 27IN ABS (SUTURE) IMPLANT
SUT VICRYL 2-0 SH 8X27 (SUTURE) IMPLANT
SUT VLOC 180 0 9IN GS21 (SUTURE) ×3 IMPLANT
SUTURE V-LC BRB 180 2/0GR9GS23 (SUTURE) IMPLANT
SYR BULB EAR ULCER 3OZ GRN STR (SYRINGE) ×3 IMPLANT
TOWEL GREEN STERILE FF (TOWEL DISPOSABLE) ×6 IMPLANT

## 2023-06-08 NOTE — Op Note (Signed)
 Operative Note  Preoperative Diagnosis: anterior vaginal prolapse, posterior vaginal prolapse, and uterovaginal prolapse, complete  Postoperative Diagnosis: same  Procedures performed:  Robotic assisted lysis of adhesions, supracervical hysterectomy, bilateral salpingo-oophorectomy, sacrocolpopexy Lorna Few Lite Y), anterior repair, perineorrhaphy, cystoscopy  Implants:  Implant Name Type Inv. Item Serial No. Manufacturer Lot No. LRB No. Used Action  MESH Grayland Ormond [id number] Mesh General MESH Arlys John  [facility] MEDICAL [phone] N/A 1 Implanted    Attending Surgeon: Lanetta Inch, MD  Anesthesia: General endotracheal  Findings: 1. On vaginal exam, stage IV prolapse present  2. On cystoscopy, normal bladder and urethral mucosa without injury or lesion. Brisk bilateral ureteral efflux present.    3. On laparoscopy, adhesions were noted from the omentum to the mid upper abdomen on the right  Specimens:  ID Type Source Tests Collected by Time Destination  1 : uterus, bilateral fallopian tubes and ovaries Tissue PATH Gyn benign resection SURGICAL PATHOLOGY Marguerita Beards, MD 06/08/2023 1551     Estimated blood loss: 100 mL  IV fluids: 1000 mL  Urine output: 400 mL  Complications: none  Procedure in Detail:  After informed consent was obtained, the patient was taken to the operating room, where general anesthesia was induced and found to be adequate. She was placed in dorsolithotomy position in yellowfin stirrups. Her hips were noted not to be hyperflexed or hyperextended. Her arms were padded with gel pads and tucked to her sides. Her hands were surrounded by foam. A padded strap was placed across her chest with foam between the pad and her skin. She was noted to be appropriately positioned with all pressure points well padded and off tension. A tilt test showed no slippage. She was prepped and draped in the usual sterile fashion. A sterile Foley  catheter was inserted. The cervix was examined and the vaginal epithelium was noted to be obscuring the cervical os. Gentle dilation was attempted but not possible. Decision was made to proceed with a supracervical hysterectomy.   0.25% plain Marcaine was injected in the supraumbilical area and an 8 mm supraumbilical skin incision was made with the scalpel. A hemostat was dissected down to the fascia. The fascia was grasped and incised. A suture was placed through either side. The peritoneum was then grasped and incised.  8mm robotic trocar was placed. The robotic camera was inserted and intraperitoneal placement was confirmed. Survey of the abdomen and pelvis revealed the findings as noted above, specifically adhesions on the right side of the abdomen. The sacrum appeared to be free of any adhesive disease. After determining placement for the other ports, Local anesthetic was injected at each site and two 8 mm incisions were made for robotic ports at 10 cm lateral at the left to level of the umbilical port. Two additional 8 mm incisions were made 10 cm lateral to these and 30 degrees down followed by 8 mm robotic ports. Laparoscopic scissors were inserted and the adhesions were taken down on the right side with electrocautery in order to place the right sided ports. All trocars were placed sequentially under direct visualization of the camera. The patient was placed in Trendelenburg. The robot was docked on the patient's right side. Monopolar endoshears were placed in the right arm, a [place] bipolar grasper was placed in the 2nd arm of the patient's left side, and a Tip up grasper was placed in the 3rd arm on the patient's left side.   Attention was then turned to the  sacral promontory. The peritoneum overlying the sacral promontory was tented up, dissected sharply with monopolar scissors and electrosurgery using layer by layer technique. The peritoneal incision was extended down to the posterior cul-de-sac.  This was performed with care to avoid the ureter on the right side and the sigmoid colon and its mesentary on the left side. Two transverse sutures of CV2 Gortex were placed in the anterior longitudinal ligament.  Attention was then turned to the robotic hysterectomy and BSO. The ureter was identified and was found to be well away from the planned site of incision. Using the monopolar scissors, a window was made on the posterior leaf of the broad ligament. The infundibulopelvic ligament was cauterized and transected. The right round ligament was grasped, cauterized, and transected with electrocautery. The anterior and posterior leaves of the broad ligament were taken down with cautery and sharp dissection. The uterine artery was skeletonized and the bladder flap was created on the right side with a combination of electrosurgery and sharp dissection.  The right uterine artery was clamped, cauterized, and transected. In a similar fashion, the left side was taken down. Further sharp dissection with combination of cautery was performed to further develop the bladder flap. The uterus was amputated from the cervix using monopolar scissors. A bag was then inserted into the abdomen and the specimen was placed inside. Cautery was performed to obtain hemostasis on the cervical surface.    The right hand instrument was replaced with monopolar endoshears. With a lucite probe in the vagina, the anterior vaginal dissection was then performed with sharp dissection and electrosurgery. Near the bladder, some adhesions prevented dissection completely distally.  The posterior vaginal dissection was then performed with sharp dissection and electrosurgery in order to dissect the rectum away from the posterior vagina. Due to large width of the vaginal walls, the anterior and posterior vaginal walls were plicated with a 2-0 vloc suture.  A "Y" mesh was then inserted into the abdomen after trimming to appropriate size. With the probe in  the vagina, the anterior leaf of the Y mesh was affixed to the anterior portion of the vagina using a 2-0 v-loc suture in a spiral pattern to distribute the suture evenly across the surface of the anterior mesh leaf. The cervix was attached to the mesh with two 2-0 prolene interrupted sutures.  In a similar fashion, the posterior leaf of the Y mesh was attached to the posterior surface of the vagina with 2-0 v-loc suture.  The distal end of the mesh was then brought to overlie the sacrum. The correct amount of tension was determined in order to elevate the vagina, but not put the mesh under tension. The distal end of the mesh was then affixed to the anterior longitudinal sacral ligament using two interrupted transverse stitches of CV2 Gortex. The excess distal mesh was then cut and removed. The peritoneum was reapproximated over the mesh using 2-0 monocryl. The bladder flap was incorporated to completely retroperitonealize the mesh. All pedicles were carefully inspected and noted to be hemostatic as the CO2 gas was deflated. All instruments were removed from the patient's abdomen.   The robot was undocked. The bag was grasped and brought through the umbilical port. The CO2 gas was removed and the ports were removed.  The umbilical incision fascia was closed with 0-vicryl suture. A deep interrupted suture was placed with -0-vicryl in the subcutaneous tissue. The other skin incisions were closed with subcutaneous stitches of 4-0 Monocryl and covered with skin glue.  Attention was turned to the vagina.  The Foley catheter was removed.  A 70-degree cystoscope was introduced, and 360-degree inspection revealed no injury, lesion or foreign body in the bladder. Brisk bilateral ureteral efflux was noted with the assistance of pyridium.  The bladder was drained and the cystoscope was removed.  The Foley catheter was replaced. There was need for an anterior repair. A self-retaining lonestar retractor was placed using  four elastic blue stays.   Two Allis clamps were along the anterior vaginal wall defect. 1% lidocaine with epinephrine was injected into the vaginal mucosa.  A vertical incision was made between these two Allis clamps with a 15 blade scalpel.  Allis clamps were placed along this incision and Metzenbaum scissors were used to undermine the vaginal mucosa along the incision.  The vaginal mucosa was then sharply dissected off to the vesicovaginal septum bilaterally to the level of the pubic rami.  Anterior plication of the vesicovaginal septum was then performed using plicating sutures of 2-0 Vicryl. The vaginal mucosal edges were trimmed and the incision reapproximated with 2-0 Vicryl in a running fashion.    The Foley catheter was removed.  A 70-degree cystoscope was introduced, and 360-degree inspection revealed no injury, lesion or foreign body in the bladder.  Good bilateral ureteral efflux was noted.  The bladder was drained and the cystoscope was removed.  The Foley catheter was reinserted.  Attention was then turned to the perineum. Two allis clamps were placed at the introitus. The perineum was injected with 1% lidocaine with epinephrine. A diamond shaped incision was made over the perineum and excess skin was removed. Dissection was performed with Metzenbaum scissors to separate the mucosa from the underlying tissue. The perineal body was then reapproximated with two interrupted 0-vicryl sutures. The perineal skin was then closed with a 2-0 vicryl in a subcutaneous and subcuticular fashion. Irrigation was performed and good hemostasis was noted. The patient tolerated the procedure well. She was awakened and taken to the recovery room in stable condition. Needle and sponge counts were correct x2.   Marguerita Beards, MD

## 2023-06-08 NOTE — Transfer of Care (Signed)
 Immediate Anesthesia Transfer of Care Note  Patient: Tiffany Powers  Procedure(s) Performed: XI ROBOTIC ASSISTED SUPRACERVICAL HYSTERECTOMY WITH BILATERAL SALPINGECTOMY, OOPHORECTOMY AND  SACROCOLPOPEXY ANTERIOR REPAIR  WITH PERINEORRHAPHY CYSTOSCOPY EXAM UNDER ANESTHESIA (Vagina ) XI ROBOTIC ASSISTED LAPAROSCOPIC LYSIS OF ADHESION (Pelvis)  Patient Location: PACU  Anesthesia Type:General  Level of Consciousness: awake, alert , oriented, and patient cooperative  Airway & Oxygen Therapy: Patient Spontanous Breathing and Patient connected to face mask oxygen  Post-op Assessment: Report given to RN, Post -op Vital signs reviewed and stable, Patient moving all extremities, Patient moving all extremities X 4, and Patient able to stick tongue midline  Post vital signs: Reviewed and stable  Last Vitals:  Vitals Value Taken Time  BP 119/58 06/08/23 1655  Temp    Pulse 88 06/08/23 1658  Resp 19 06/08/23 1658  SpO2 100 % 06/08/23 1658  Vitals shown include unfiled device data.  Last Pain:  Vitals:   06/08/23 0948  PainSc: 3       Patients Stated Pain Goal: 7 (06/08/23 0948)  Complications: No notable events documented.

## 2023-06-08 NOTE — Discharge Instructions (Signed)

## 2023-06-08 NOTE — Anesthesia Postprocedure Evaluation (Signed)
 Anesthesia Post Note  Patient: Tiffany Powers  Procedure(s) Performed: XI ROBOTIC ASSISTED SUPRACERVICAL HYSTERECTOMY WITH BILATERAL SALPINGECTOMY, OOPHORECTOMY AND  SACROCOLPOPEXY ANTERIOR REPAIR  WITH PERINEORRHAPHY CYSTOSCOPY EXAM UNDER ANESTHESIA (Vagina ) XI ROBOTIC ASSISTED LAPAROSCOPIC LYSIS OF ADHESION (Pelvis)     Patient location during evaluation: PACU Anesthesia Type: General Level of consciousness: awake Pain management: pain level controlled Vital Signs Assessment: post-procedure vital signs reviewed and stable Respiratory status: spontaneous breathing, nonlabored ventilation and respiratory function stable Cardiovascular status: blood pressure returned to baseline and stable Postop Assessment: no apparent nausea or vomiting Anesthetic complications: no   No notable events documented.  Last Vitals:  Vitals:   06/08/23 1745 06/08/23 1800  BP: 130/72 132/67  Pulse: 88 82  Resp: 11 17  Temp:  36.6 C  SpO2: 99% 97%    Last Pain:  Vitals:   06/08/23 1745  PainSc: Asleep                 Odas Ozer P Jadeyn Hargett

## 2023-06-08 NOTE — Interval H&P Note (Signed)
 History and Physical Interval Note:  06/08/2023 11:14 AM  [name]  has presented today for surgery, with the diagnosis of uterovaginal prolapse incomplete; stress urinary incontinence.  The various methods of treatment have been discussed with the patient and family. After consideration of risks, benefits and other options for treatment, the patient has consented to  Procedure(s) with comments: XI ROBOTIC ASSISTED TOTAL HYSTERECTOMY WITH BILATERAL SALPINGECTOMY, OOPHORECTOMY AND  SACROCOLPOPEXY (N/A)  POSTERIOR REPAIR (RECTOCELE) WITH PERINEORRHAPHY --possible (N/A) TRANSVAGINAL TAPE (TVT) PROCEDURE--possible (N/A) CYSTOSCOPY (N/A) EXAM UNDER ANESTHESIA as a surgical intervention.  The patient's history has been reviewed, patient examined, no change in status, stable for surgery.  I have reviewed the patient's chart and labs.  Questions were answered to the patient's satisfaction.     Marguerita Beards

## 2023-06-08 NOTE — Anesthesia Procedure Notes (Signed)
 Procedure Name: Intubation Date/Time: 06/08/2023 12:04 PM  Performed by: Bishop Limbo, CRNAPre-anesthesia Checklist: Patient identified, Emergency Drugs available, Suction available and Patient being monitored Patient Re-evaluated:Patient Re-evaluated prior to induction Oxygen Delivery Method: Circle System Utilized Preoxygenation: Pre-oxygenation with 100% oxygen Induction Type: IV induction Ventilation: Mask ventilation without difficulty Laryngoscope Size: Mac and 3 Grade View: Grade I Tube type: Oral Tube size: 7.0 mm Number of attempts: 1 Airway Equipment and Method: Stylet Placement Confirmation: ETT inserted through vocal cords under direct vision, positive ETCO2 and breath sounds checked- equal and bilateral Secured at: 22 cm Tube secured with: Tape Dental Injury: Teeth and Oropharynx as per pre-operative assessment

## 2023-06-09 ENCOUNTER — Encounter (HOSPITAL_COMMUNITY): Payer: Self-pay | Admitting: Obstetrics and Gynecology

## 2023-06-09 ENCOUNTER — Telehealth: Payer: Self-pay | Admitting: Obstetrics and Gynecology

## 2023-06-09 NOTE — Telephone Encounter (Signed)
 Tiffany Powers underwent Robotic assisted lysis of adhesions, supracervical hysterectomy, bilateral salpingo-oophorectomy, sacrocolpopexy Tiffany Few Lite Y), anterior repair, perineorrhaphy, cystoscopy on 06/08/23.   She failed her voiding trial.  was backfilled into the bladder She was unable to void  She was discharged with a catheter. Please call her for a routine post op check and to schedule a voiding trial by Thurs or Fri this week (3/27-3/28). Thanks!  Marguerita Beards, MD

## 2023-06-09 NOTE — Addendum Note (Signed)
 Addendum  created 06/09/23 2119 by Achille Rich, MD   Attestation recorded in Intraprocedure, Intraprocedure Attestations filed

## 2023-06-10 LAB — SURGICAL PATHOLOGY

## 2023-06-11 ENCOUNTER — Ambulatory Visit: Payer: Medicare Other

## 2023-06-11 NOTE — Patient Instructions (Signed)
  Please drink lots of water and try to expel as much urine as you are inputting. If you are unable to urinate, give the office a call before 4 pm and if after 4 pm, please contact the after hour service.     Please keep all future appointments and if you have any questions or concerns please feel free to contact our office at [phone].

## 2023-06-11 NOTE — Progress Notes (Signed)
 Tiffany Powers is a 68 y.o. female  here for a voiding trial.  16 fr cath removed with no complications.  Instilled: 200 ml Voided: 190 ml PVR: 24 ml

## 2023-06-12 NOTE — Telephone Encounter (Signed)
 Called and spoke to patient:   Patient reports she has been able to urinate Patient reports she is taking Tylenol and ibuprofen. Reports pain 3/10 on scale of 1-10.  Reports taking frequent rest periods.  States minimal to no bleeding.  Has been able to have a bowel movement.  Overall is doing well, just mostly fatigued and is taking frequent rests as needed. Patient to call with any concerns.

## 2023-06-30 ENCOUNTER — Other Ambulatory Visit: Payer: Self-pay | Admitting: Family Medicine

## 2023-06-30 DIAGNOSIS — I252 Old myocardial infarction: Secondary | ICD-10-CM

## 2023-07-11 ENCOUNTER — Other Ambulatory Visit: Payer: Self-pay | Admitting: Family Medicine

## 2023-07-11 DIAGNOSIS — E785 Hyperlipidemia, unspecified: Secondary | ICD-10-CM

## 2023-07-17 ENCOUNTER — Ambulatory Visit: Payer: Medicare Other | Admitting: Obstetrics and Gynecology

## 2023-07-17 ENCOUNTER — Encounter: Payer: Self-pay | Admitting: Obstetrics and Gynecology

## 2023-07-17 VITALS — BP 134/68 | HR 72

## 2023-07-17 DIAGNOSIS — Z48816 Encounter for surgical aftercare following surgery on the genitourinary system: Secondary | ICD-10-CM

## 2023-07-17 DIAGNOSIS — Z9889 Other specified postprocedural states: Secondary | ICD-10-CM

## 2023-07-17 NOTE — Progress Notes (Signed)
 [facility]  Date of Visit: 07/17/2023  History of Present Illness: Tiffany Powers is a 68 y.o. female scheduled today for a post-operative visit.   Surgery: s/p Robotic assisted lysis of adhesions, supracervical hysterectomy, bilateral salpingo-oophorectomy, sacrocolpopexy Lorna Few Lite Y), anterior repair, perineorrhaphy, cystoscopy on 06/08/23  She did not pass her postoperative void trial.   Postoperative course has been uncomplicated.   Today she reports she is feeling well overall. Had some irritation at the vaginal opening but improved with estrogen use.   UTI in the last 6 weeks? No  Pain? No  She has returned to her normal activity (except for postop restrictions) Vaginal bulge? No  Stress incontinence: No  Urgency/frequency: No  Urge incontinence: No  Voiding dysfunction: No  Bowel issues: No   Subjective Success: Do you usually have a bulge or something falling out that you can see or feel in the vaginal area? No  Retreatment Success: Any retreatment with surgery or pessary for any compartment? 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   Allergies: Patient has no known allergies.   Physical Exam: BP 134/68   Pulse 72   Abdomen: soft, non-tender, without masses or organomegaly Laparoscopic Incisions: healing well.  Pelvic Examination: small suture at perineum, removed with scissors. Vagina: Incisions healing well. Sutures are present at the anterior incision line and there is not granulation tissue.  No tenderness along the anterior or posterior vagina. No apical tenderness. No pelvic masses. No visible or palpable mesh.  POP-Q: POP-Q  -3                                            Aa   -3                                           Ba  -7.5                                              C   2                                            Gh  4.5                                            Pb  8                                            tvl   -  3                                            Ap  -3                                            Bp  -7.5                                              D    ---------------------------------------------------------  Assessment and Plan:  1. Post-operative state     - Healing well overall but some vaginal suture still left. Continue with vaginal estrogen cream twice a week to promote healing. Will plan to reexamine in a month.  - Pathology results were reviewed with the patient today and she verbalized understanding that the results were benign.  - Can resume regular activity including exercise. Discussed avoidance of heavy lifting and straining long term to reduce the risk of recurrence. Wait another 6 weeks for intercourse.    Return in about 1 month (around 08/16/2023) for post op.  Marguerita Beards

## 2023-07-19 ENCOUNTER — Other Ambulatory Visit: Payer: Self-pay | Admitting: Family Medicine

## 2023-07-19 DIAGNOSIS — I252 Old myocardial infarction: Secondary | ICD-10-CM

## 2023-07-19 DIAGNOSIS — E785 Hyperlipidemia, unspecified: Secondary | ICD-10-CM

## 2023-08-14 DIAGNOSIS — H04123 Dry eye syndrome of bilateral lacrimal glands: Secondary | ICD-10-CM | POA: Diagnosis not present

## 2023-08-14 DIAGNOSIS — H02825 Cysts of left lower eyelid: Secondary | ICD-10-CM | POA: Diagnosis not present

## 2023-08-14 DIAGNOSIS — H02824 Cysts of left upper eyelid: Secondary | ICD-10-CM | POA: Diagnosis not present

## 2023-08-14 DIAGNOSIS — H524 Presbyopia: Secondary | ICD-10-CM | POA: Diagnosis not present

## 2023-08-14 DIAGNOSIS — H25813 Combined forms of age-related cataract, bilateral: Secondary | ICD-10-CM | POA: Diagnosis not present

## 2023-08-14 LAB — HM DIABETES EYE EXAM

## 2023-08-24 ENCOUNTER — Encounter: Payer: Self-pay | Admitting: Obstetrics and Gynecology

## 2023-08-24 ENCOUNTER — Ambulatory Visit (INDEPENDENT_AMBULATORY_CARE_PROVIDER_SITE_OTHER): Admitting: Obstetrics and Gynecology

## 2023-08-24 VITALS — BP 120/63 | HR 62

## 2023-08-24 DIAGNOSIS — Z9889 Other specified postprocedural states: Secondary | ICD-10-CM

## 2023-08-24 DIAGNOSIS — Z48816 Encounter for surgical aftercare following surgery on the genitourinary system: Secondary | ICD-10-CM

## 2023-08-24 NOTE — Progress Notes (Signed)
 [facility]  Date of Visit: 08/24/2023  History of Present Illness: Tiffany Powers is a 68 y.o. female scheduled today for a post-operative visit.   Surgery: s/p Robotic assisted lysis of adhesions, supracervical hysterectomy, bilateral salpingo-oophorectomy, sacrocolpopexy Cleopatra Dada Lite Y), anterior repair, perineorrhaphy, cystoscopy on 06/08/23  She has been feeling well. She is walking about 2 miles a day. Has not had any concerns.   Medications: She has a current medication list which includes the following prescription(s): acetaminophen , ascorbic acid, clopidogrel , cyanocobalamin , estradiol , ezetimibe , hydrochlorothiazide , ibuprofen , nitroglycerin , rosuvastatin , telmisartan , and vitamin d .   Allergies: Patient has no known allergies.   Physical Exam: BP 120/63   Pulse 62   Abdomen: soft, non-tender, without masses or organomegaly Laparoscopic Incisions: healing well.  Pelvic Examination: Perineum well healed. On speculum, no sutures or visible mesh present, normal appearing cervix.    ---------------------------------------------------------  Assessment and Plan:  1. Post-operative state      - Well healed.  Discussed avoidance of heavy lifting and straining long term to reduce the risk of recurrence.  - Ok to resume intercourse.   Follow up as needed   Arma Lamp

## 2023-09-20 NOTE — Patient Instructions (Signed)
 It was great to see you again today, I will be in touch with your labs

## 2023-09-20 NOTE — Progress Notes (Unsigned)
 [facility] [address], [address] [place], [place] [address] [phone]  Date:  09/23/2023   Name:  Tiffany Powers   DOB:  1955/06/27   MRN:  [id number]  PCP:  Kaylee Partridge, MD    Chief Complaint: No chief complaint on file.   History of Present Illness:  Tiffany Powers is a 68 y.o. 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  At our last visit her mother had passed away and her sister was dealing with end-stage dementia  She enjoys regular exercise with walking and a little jogging  Foot exam needs updated Can do urine micro Recommend Shingrix  She had surgery in February per Dr. Frutoso Jing-  Exam under anesthesia, robotic assisted total laparoscopic hysterectomy, bilateral salpingo-oophorectomy, sacrocolpopexy, perineorrhaphy, cystoscopy on 06/08/23.  Her symptoms include vaginal bulge, and she was was found to have Stage IV anterior, Stage IV posterior, Stage IV apical prolapse.  She saw Dr. 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  She has had no episodes of chest pain, no indications for ischemic evaluation.  Continue Plavix  75 mg daily, continue Zetia  10 mg daily, continue Crestor  40 mg daily, continue nitroglycerin  PRN-- has not needed. HTN -blood pressure is controlled today at 132/76, continue Micardis  40 mg daily, continue HCTZ 25 mg daily. Dyslipidemia-formally monitored by her PCP, most recent LDL was 84, prefer this to be less than 70.  She is on Zetia  as well as Crestor .  She has intentionally lost more weight since  this time and anticipate her LDL will be in the acceptable range the next time it is checked. Carotid artery stenosis-mild bilaterally per most recent ultrasound in 2023, continue with medication regimen as outlined above.  Lab Results  Component Value Date   HGBA1C 6.2 (H) 06/03/2023   Some labs on chart from February-BMP, A1c, CBC Plavix  75 Estradiol  cream Zetia  HCTZ 25 Crestor  40 Telmisartan  40  Patient Active Problem List   Diagnosis Date Noted   Diabetes mellitus without complication (HCC)    High cholesterol    Coronary artery disease cardiac catheterization 2017 showing 99% stenosis of small intermediate branch, up to 30% proximal LAD 07/01/2021   Carpal tunnel syndrome of right wrist 10/19/2019   Controlled type 2 diabetes mellitus without complication, without long-term current use of insulin (HCC) 12/31/2017   Pure hypercholesterolemia 02/19/2017   Hyperlipidemia 07/30/2016   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   NSTEMI (non-ST elevated myocardial infarction) (HCC) 12/07/2015   Hypertension 2001   MI (myocardial infarction) (HCC) 2001    Past Medical History:  Diagnosis Date   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   Arthritis    Carpal tunnel syndrome of right wrist 10/19/2019   Controlled type 2 diabetes mellitus without complication, without long-term current use of insulin (HCC) 12/31/2017   Coronary artery disease cardiac catheterization 2017 showing 99% stenosis of small intermediate branch, up to 30% proximal LAD 07/01/2021   Diabetes mellitus without complication (HCC)    High cholesterol    Hyperlipidemia 07/30/2016   Hypertension    MI (myocardial infarction) (HCC) 2001  NSTEMI (non-ST elevated myocardial infarction) (HCC) 12/07/2015   Pure hypercholesterolemia 02/19/2017    Past Surgical History:  Procedure Laterality Date   CARDIAC CATHETERIZATION  2001; 2002; 12/06/2105   CARDIAC CATHETERIZATION N/A 12/07/2015   Procedure: Left Heart Cath and Coronary  Angiography;  Surgeon: Knox Perl, MD;  Location: [facility] INVASIVE CV LAB;  Service: Cardiovascular;  Laterality: N/A;   COLONOSCOPY  2023   CYSTOSCOPY N/A 06/08/2023   Procedure: CYSTOSCOPY;  Surgeon: Arma Lamp, MD;  Location: [facility] OR;  Service: Gynecology;  Laterality: N/A;   RECTOCELE REPAIR N/A 06/08/2023   Procedure: ANTERIOR REPAIR  WITH PERINEORRHAPHY;  Surgeon: Arma Lamp, MD;  Location[facility] OR;  Service: Gynecology;  Laterality: N/A;   ROBOTIC ASSISTED LAPAROSCOPIC LYSIS OF ADHESION  06/08/2023   Procedure: XI ROBOTIC ASSISTED LAPAROSCOPIC LYSIS OF ADHESION;  Surgeon: Arma Lamp, MD;  Location: [facility] OR;  Service: Gynecology;;   TUBAL LIGATION  1980s   XI ROBOTIC ASSISTED TOTAL HYSTERECTOMY WITH SACROCOLPOPEXY N/A 06/08/2023   Procedure: XI ROBOTIC ASSISTED SUPRACERVICAL HYSTERECTOMY WITH BILATERAL SALPINGECTOMY, OOPHORECTOMY AND  SACROCOLPOPEXY;  Surgeon: Arma Lamp, MD;  Location: [facility] OR;  Service: Gynecology;  Laterality: N/A;  Total time requested is 3.5hrs; assist requested [facility]    Social History   Tobacco Use   Smoking status: Never    Passive exposure: Never   Smokeless tobacco: Never  Vaping Use   Vaping status: Never Used  Substance Use Topics   Alcohol use: Not Currently    Alcohol/week: 1.0 standard drink of alcohol    Types: 1 Glasses of wine per week    Comment: No drinks since 2024   Drug use: No    Family History  Problem Relation Age of Onset   CAD Father 15       MI   CAD Brother 29       MI   CAD Maternal Grandmother 78       CABG    No Known Allergies  Medication list has been reviewed and updated.  Current Outpatient Medications on File Prior to Visit  Medication Sig Dispense Refill   acetaminophen  (TYLENOL ) 500 MG tablet Take 1 tablet (500 mg total) by mouth every 6 (six) hours as needed (pain). 30 tablet 0   Ascorbic Acid (VITAMIN C PO) Take 1 tablet by mouth daily.     clopidogrel  (PLAVIX ) 75 MG tablet TAKE 1  TABLET BY MOUTH EVERY DAY 90 tablet 3   Cyanocobalamin  (VITAMIN B-12 PO) Take 1 tablet by mouth daily.     estradiol  (ESTRACE ) 0.1 MG/GM vaginal cream Place 0.5g twice a week at night 30 g 11   ezetimibe  (ZETIA ) 10 MG tablet TAKE 1 TABLET BY MOUTH EVERY DAY 90 tablet 3   hydrochlorothiazide  (HYDRODIURIL ) 25 MG tablet TAKE 1 TABLET (25 MG TOTAL) BY MOUTH DAILY. 90 tablet 3   ibuprofen  (ADVIL ) 600 MG tablet Take 1 tablet (600 mg total) by mouth every 6 (six) hours as needed. 30 tablet 0   nitroGLYCERIN  (NITROSTAT ) 0.4 MG SL tablet Place 1 tablet (0.4 mg total) under the tongue every 5 (five) minutes as needed for chest pain. 25 tablet 1   rosuvastatin  (CRESTOR ) 40 MG tablet Take 1 tablet (40 mg total) by mouth daily. Appt for refills 90 tablet 0   telmisartan  (MICARDIS ) 40 MG tablet Take 1 tablet (40 mg total) by mouth daily. Need appointment with PCP 90 tablet 0   VITAMIN D  PO Take 1 capsule by mouth daily.  No current facility-administered medications on file prior to visit.    Review of Systems:  As per HPI- otherwise negative.   Physical Examination: There were no vitals filed for this visit. There were no vitals filed for this visit. There is no height or weight on file to calculate BMI. Ideal Body Weight:    GEN: no acute distress. HEENT: Atraumatic, Normocephalic.  Ears and Nose: No external deformity. CV: RRR, No M/G/R. No JVD. No thrill. No extra heart sounds. PULM: CTA B, no wheezes, crackles, rhonchi. No retractions. No resp. distress. No accessory muscle use. ABD: S, NT, ND, +BS. No rebound. No HSM. EXTR: No c/c/e PSYCH: Normally interactive. Conversant.  Foot exam  Assessment and Plan: *** Physical exam today.  Encouraged healthy diet and exercise routine  Signed Gates Kasal, MD

## 2023-09-23 ENCOUNTER — Ambulatory Visit: Admitting: Family Medicine

## 2023-09-23 ENCOUNTER — Encounter: Payer: Self-pay | Admitting: Family Medicine

## 2023-09-23 ENCOUNTER — Telehealth: Payer: Self-pay

## 2023-09-23 VITALS — BP 118/70 | HR 88 | Ht 60.0 in | Wt 144.0 lb

## 2023-09-23 DIAGNOSIS — E119 Type 2 diabetes mellitus without complications: Secondary | ICD-10-CM

## 2023-09-23 DIAGNOSIS — E785 Hyperlipidemia, unspecified: Secondary | ICD-10-CM

## 2023-09-23 DIAGNOSIS — E2839 Other primary ovarian failure: Secondary | ICD-10-CM

## 2023-09-23 DIAGNOSIS — E538 Deficiency of other specified B group vitamins: Secondary | ICD-10-CM | POA: Diagnosis not present

## 2023-09-23 DIAGNOSIS — Z1329 Encounter for screening for other suspected endocrine disorder: Secondary | ICD-10-CM | POA: Diagnosis not present

## 2023-09-23 DIAGNOSIS — Z0000 Encounter for general adult medical examination without abnormal findings: Secondary | ICD-10-CM | POA: Diagnosis not present

## 2023-09-23 DIAGNOSIS — F4323 Adjustment disorder with mixed anxiety and depressed mood: Secondary | ICD-10-CM

## 2023-09-23 MED ORDER — ALPRAZOLAM 0.25 MG PO TABS: 0.2500 mg | ORAL_TABLET | Freq: Two times a day (BID) | ORAL | 0 refills | Status: DC | PRN

## 2023-09-23 NOTE — Telephone Encounter (Signed)
 Called pts pharmacy to confirm Shingrix vaccine, pharmacy staff stated pt has only had one dose of the vaccine on 08/02/2021.

## 2023-09-24 ENCOUNTER — Other Ambulatory Visit

## 2023-09-24 DIAGNOSIS — E538 Deficiency of other specified B group vitamins: Secondary | ICD-10-CM | POA: Diagnosis not present

## 2023-09-24 DIAGNOSIS — E785 Hyperlipidemia, unspecified: Secondary | ICD-10-CM

## 2023-09-24 DIAGNOSIS — E119 Type 2 diabetes mellitus without complications: Secondary | ICD-10-CM

## 2023-09-24 DIAGNOSIS — Z1329 Encounter for screening for other suspected endocrine disorder: Secondary | ICD-10-CM | POA: Diagnosis not present

## 2023-09-25 LAB — MICROALBUMIN / CREATININE URINE RATIO
Creatinine, Urine: 36 mg/dL (ref 20–275)
Microalb Creat Ratio: 6 mg/g{creat}
Microalb, Ur: 0.2 mg/dL

## 2023-09-25 LAB — HEMOGLOBIN A1C
Hgb A1c MFr Bld: 6.6 % — ABNORMAL HIGH (ref <5.7)
Mean Plasma Glucose: 143 mg/dL
eAG (mmol/L): 7.9 mmol/L

## 2023-09-25 LAB — LIPID PANEL
Cholesterol: 129 mg/dL
HDL: 49 mg/dL — ABNORMAL LOW
LDL Cholesterol (Calc): 66 mg/dL
Non-HDL Cholesterol (Calc): 80 mg/dL
Total CHOL/HDL Ratio: 2.6 (calc)
Triglycerides: 62 mg/dL

## 2023-09-25 LAB — VITAMIN B12: Vitamin B-12: 716 pg/mL (ref 200–1100)

## 2023-09-25 LAB — TSH: TSH: 2.16 m[IU]/L (ref 0.40–4.50)

## 2023-10-11 ENCOUNTER — Other Ambulatory Visit: Payer: Self-pay | Admitting: Family Medicine

## 2023-10-11 DIAGNOSIS — I252 Old myocardial infarction: Secondary | ICD-10-CM

## 2023-10-15 ENCOUNTER — Other Ambulatory Visit: Payer: Self-pay | Admitting: Family Medicine

## 2023-10-15 DIAGNOSIS — E785 Hyperlipidemia, unspecified: Secondary | ICD-10-CM

## 2023-11-26 ENCOUNTER — Ambulatory Visit (HOSPITAL_BASED_OUTPATIENT_CLINIC_OR_DEPARTMENT_OTHER)
Admission: RE | Admit: 2023-11-26 | Discharge: 2023-11-26 | Disposition: A | Discharge status: Home / Self Care | Source: Ambulatory Visit | Attending: Family Medicine | Admitting: Family Medicine

## 2023-11-26 DIAGNOSIS — E2839 Other primary ovarian failure: Secondary | ICD-10-CM | POA: Diagnosis present

## 2023-11-26 DIAGNOSIS — Z780 Asymptomatic menopausal state: Secondary | ICD-10-CM | POA: Diagnosis not present

## 2023-11-27 ENCOUNTER — Encounter: Payer: Self-pay | Admitting: Family Medicine

## 2023-12-16 ENCOUNTER — Encounter: Admitting: Family Medicine

## 2023-12-18 ENCOUNTER — Ambulatory Visit: Admitting: *Deleted

## 2023-12-18 VITALS — Ht 60.0 in | Wt 145.0 lb

## 2023-12-18 DIAGNOSIS — Z0000 Encounter for general adult medical examination without abnormal findings: Secondary | ICD-10-CM | POA: Diagnosis not present

## 2023-12-18 DIAGNOSIS — Z1231 Encounter for screening mammogram for malignant neoplasm of breast: Secondary | ICD-10-CM

## 2023-12-18 NOTE — Progress Notes (Signed)
 Please attest this visit in the absence of patient primary care provider.    Subjective:   Tiffany Powers is a 68 y.o. who presents for a Medicare Wellness preventive visit.  As a reminder, Annual Wellness Visits don't include a physical exam, and some assessments may be limited, especially if this visit is performed virtually. We may recommend an in-person follow-up visit with your provider if needed.  Visit Complete: Virtual I connected with  Tiffany Powers on 12/18/23 by a audio enabled telemedicine application and verified that I am speaking with the correct person using two identifiers.  Patient Location: Home  Provider Location: Office/Clinic  I discussed the limitations of evaluation and management by telemedicine. The patient expressed understanding and agreed to proceed.  Vital Signs: Because this visit was a virtual/telehealth visit, some criteria may be missing or patient reported. Any vitals not documented were not able to be obtained and vitals that have been documented are patient reported.  VideoDeclined- This patient declined Librarian, academic. Therefore the visit was completed with audio only.  Persons Participating in Visit: Patient.  AWV Questionnaire: No: Patient Medicare AWV questionnaire was not completed prior to this visit.  Cardiac Risk Factors include: advanced age (>69men, >37 women);diabetes mellitus;dyslipidemia;hypertension;Other (see comment), Risk factor comments: CAD, Hx of MI     Objective:    Today's Vitals   12/18/23 1108  Weight: 145 lb (65.8 kg)  Height: 5' (1.524 m)   Body mass index is 28.32 kg/m.     12/18/2023   11:23 AM 06/08/2023    9:51 AM 06/03/2023    1:08 PM 12/09/2022    8:38 AM 12/02/2021   12:35 PM 12/07/2015    8:54 PM  Advanced Directives  Does Patient Have a Medical Advance Directive? Yes Yes Yes Yes Yes No   Type of Estate agent of [employer] Power of  [place];Living will Healthcare Power of [employer] of [place];Living will Healthcare Power of Attorney   Does patient want to make changes to medical advance directive? No - Patient declined   No - Patient declined    Copy of Healthcare Power of Attorney in Chart? No - copy requested No - copy requested No - copy requested No - copy requested No - copy requested   Would patient like information on creating a medical advance directive?      Yes - Educational materials given      Data saved with a previous flowsheet row definition    Current Medications (verified) Outpatient Encounter Medications as of 12/18/2023  Medication Sig   acetaminophen  (TYLENOL ) 500 MG tablet Take 1 tablet (500 mg total) by mouth every 6 (six) hours as needed (pain).   ALPRAZolam  (XANAX ) 0.25 MG tablet Take 1 tablet (0.25 mg total) by mouth 2 (two) times daily as needed for anxiety.   Ascorbic Acid (VITAMIN C PO) Take 1 tablet by mouth daily.   clopidogrel  (PLAVIX ) 75 MG tablet TAKE 1 TABLET BY MOUTH EVERY DAY   Cyanocobalamin  (VITAMIN B-12 PO) Take 1 tablet by mouth daily.   estradiol  (ESTRACE ) 0.1 MG/GM vaginal cream Place 0.5g twice a week at night   ezetimibe  (ZETIA ) 10 MG tablet TAKE 1 TABLET BY MOUTH EVERY DAY   hydrochlorothiazide  (HYDRODIURIL ) 25 MG tablet TAKE 1 TABLET (25 MG TOTAL) BY MOUTH DAILY.   ibuprofen  (ADVIL ) 600 MG tablet Take 1 tablet (600 mg total) by mouth every 6 (six) hours as needed.   nitroGLYCERIN  (NITROSTAT ) 0.4 MG  SL tablet Place 1 tablet (0.4 mg total) under the tongue every 5 (five) minutes as needed for chest pain.   rosuvastatin  (CRESTOR ) 40 MG tablet Take 1 tablet (40 mg total) by mouth daily.   telmisartan  (MICARDIS ) 40 MG tablet TAKE 1 TABLET (40 MG TOTAL) BY MOUTH DAILY. NEED APPOINTMENT WITH PCP   VITAMIN D  PO Take 1 capsule by mouth daily.   No facility-administered encounter medications on file as of 12/18/2023.    Allergies (verified) Patient has no known  allergies.   History: Past Medical History:  Diagnosis Date   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   Arthritis    Carpal tunnel syndrome of right wrist 10/19/2019   Controlled type 2 diabetes mellitus without complication, without long-term current use of insulin (HCC) 12/31/2017   Coronary artery disease cardiac catheterization 2017 showing 99% stenosis of small intermediate branch, up to 30% proximal LAD 07/01/2021   Diabetes mellitus without complication (HCC)    High cholesterol    Hyperlipidemia 07/30/2016   Hypertension    MI (myocardial infarction) (HCC) 2001   NSTEMI (non-ST elevated myocardial infarction) (HCC) 12/07/2015   Pure hypercholesterolemia 02/19/2017   Past Surgical History:  Procedure Laterality Date   CARDIAC CATHETERIZATION  2001; 2002; 12/06/2105   CARDIAC CATHETERIZATION N/A 12/07/2015   Procedure: Left Heart Cath and Coronary Angiography;  Surgeon: Gordy Bergamo, MD;  Location: [facility] INVASIVE CV LAB;  Service: Cardiovascular;  Laterality: N/A;   COLONOSCOPY  2023   CYSTOSCOPY N/A 06/08/2023   Procedure: CYSTOSCOPY;  Surgeon: Marilynne Rosaline SAILOR, MD;  Location: [facility] OR;  Service: Gynecology;  Laterality: N/A;   RECTOCELE REPAIR N/A 06/08/2023   Procedure: ANTERIOR REPAIR  WITH PERINEORRHAPHY;  Surgeon: Marilynne Rosaline SAILOR, MD;  Location: [facility] OR;  Service: Gynecology;  Laterality: N/A;   ROBOTIC ASSISTED LAPAROSCOPIC LYSIS OF ADHESION  06/08/2023   Procedure: XI ROBOTIC ASSISTED LAPAROSCOPIC LYSIS OF ADHESION;  Surgeon: Marilynne Rosaline SAILOR, MD;  Location: [facility] OR;  Service: Gynecology;;   TUBAL LIGATION  1980s   XI ROBOTIC ASSISTED TOTAL HYSTERECTOMY WITH SACROCOLPOPEXY N/A 06/08/2023   Procedure: XI ROBOTIC ASSISTED SUPRACERVICAL HYSTERECTOMY WITH BILATERAL SALPINGECTOMY, OOPHORECTOMY AND  SACROCOLPOPEXY;  Surgeon: Marilynne Rosaline SAILOR, MD;  Location: [facility] OR;  Service: Gynecology;  Laterality: N/A;  Total time requested is 3.5hrs; assist requested [facility]   Family History   Problem Relation Age of Onset   CAD Father 43       MI   Dementia Sister    CAD Brother 57       MI   CAD Maternal Grandmother 37       CABG   Social History   Socioeconomic History   Marital status: Divorced    Spouse name: Not on file   Number of children: Not on file   Years of education: Not on file   Highest education level: Associate degree: occupational, Scientist, product/process development, or vocational program  Occupational History   Not on file  Tobacco Use   Smoking status: Never    Passive exposure: Never   Smokeless tobacco: Never  Vaping Use   Vaping status: Never Used  Substance and Sexual Activity   Alcohol use: Not Currently    Alcohol/week: 1.0 standard drink of alcohol    Types: 1 Glasses of wine per week    Comment: No drinks since 2024   Drug use: No   Sexual activity: Not Currently    Birth control/protection: Post-menopausal  Other Topics Concern   Not on file  Social History  Narrative   Not on file   Social Drivers of Health   Financial Resource Strain: Low Risk  (09/17/2023)   Overall Financial Resource Strain (CARDIA)    Difficulty of Paying Living Expenses: Not very hard  Food Insecurity: No Food Insecurity (12/18/2023)   Hunger Vital Sign    Worried About Running Out of Food in the Last Year: Never true    Ran Out of Food in the Last Year: Never true  Transportation Needs: No Transportation Needs (09/17/2023)   PRAPARE - Administrator, Civil Service (Medical): No    Lack of Transportation (Non-Medical): No  Physical Activity: Sufficiently Active (09/17/2023)   Exercise Vital Sign    Days of Exercise per Week: 6 days    Minutes of Exercise per Session: 50 min  Stress: Stress Concern Present (12/18/2023)   [employer] of Occupational Health - Occupational Stress Questionnaire    Feeling of Stress: Rather much  Social Connections: Moderately Integrated (09/17/2023)   Social Connection and Isolation Panel    Frequency of Communication with Friends and  Family: More than three times a week    Frequency of Social Gatherings with Friends and Family: Three times a week    Attends Religious Services: More than 4 times per year    Active Member of Clubs or Organizations: Yes    Attends Engineer, structural: More than 4 times per year    Marital Status: Divorced    Tobacco Counseling Counseling given: Not Answered    Clinical Intake:  Pre-visit preparation completed: Yes  Pain : No/denies pain     BMI - recorded: 28.32 Nutritional Status: BMI 25 -29 Overweight Nutritional Risks: None Diabetes: Yes CBG done?: No Did pt. bring in CBG monitor from home?: No  Lab Results  Component Value Date   HGBA1C 6.6 (H) 09/24/2023   HGBA1C 6.2 (H) 06/03/2023   HGBA1C 6.8 (H) 06/18/2022     How often do you need to have someone help you when you read instructions, pamphlets, or other written materials from your doctor or pharmacy?: 1 - Never What is the last grade level you completed in school?: Associate's degree  Interpreter Needed?: No  Information entered by :: Lolita Libra, CMA   Activities of Daily Living     12/18/2023   11:14 AM 06/08/2023    9:49 AM  In your present state of health, do you have any difficulty performing the following activities:  Hearing? 0 0  Vision? 0 0  Difficulty concentrating or making decisions? 0 0  Walking or climbing stairs? 0   Dressing or bathing? 0   Doing errands, shopping? 0   Preparing Food and eating ? N   Using the Toilet? N   In the past six months, have you accidently leaked urine? N   Do you have problems with loss of bowel control? N   Managing your Medications? N   Managing your Finances? N   Housekeeping or managing your Housekeeping? N     Patient Care Team: Copland, Harlene BROCKS, MD as PCP - General (Family Medicine) Bernie Lamar PARAS, MD as PCP - Cardiology (Cardiology) Delories Lukes, OD (Optometry)  I have updated your Care Teams any recent Medical  Services you may have received from other providers in the past year.     Assessment:   This is a routine wellness examination for Tiffany Powers.  Hearing/Vision screen Hearing Screening - Comments:: Denies hearing difficulties.  Vision Screening - Comments:: Wears RX  glasses -- up to date with routine eye exams with [facility]     Goals Addressed   None    Depression Screen     12/18/2023   11:19 AM 09/23/2023    1:14 PM 12/09/2022    8:33 AM 06/18/2022    2:33 PM 12/02/2021   12:38 PM 06/06/2021   10:27 AM  PHQ 2/9 Scores  PHQ - 2 Score 1 0 0 0 1 0  PHQ- 9 Score 3         Fall Risk     12/18/2023   11:24 AM 09/23/2023    1:14 PM 12/09/2022    8:28 AM 06/18/2022    2:33 PM 12/02/2021   12:37 PM  Fall Risk   Falls in the past year? 0 0 0 0 0  Number falls in past yr: 0 0 0 0 0  Injury with Fall? 0 0 0 0 0  Risk for fall due to : No Fall Risks No Fall Risks No Fall Risks No Fall Risks   Follow up Education provided Falls evaluation completed Falls evaluation completed  Falls prevention discussed      Data saved with a previous flowsheet row definition    MEDICARE RISK AT HOME:  Medicare Risk at Home Any stairs in or around the home?: No If so, are there any without handrails?: No Home free of loose throw rugs in walkways, pet beds, electrical cords, etc?: Yes Adequate lighting in your home to reduce risk of falls?: Yes Life alert?: No Use of a cane, walker or w/c?: No Grab bars in the bathroom?: No Shower chair or bench in shower?: No Elevated toilet seat or a handicapped toilet?: No  TIMED UP AND GO:  Was the test performed?  No, audio  Cognitive Function: 6CIT completed        12/18/2023   11:24 AM 12/09/2022    8:34 AM 12/02/2021   12:43 PM  6CIT Screen  What Year? 0 points 0 points 0 points  What month? 0 points 0 points 0 points  What time? 0 points 0 points 0 points  Count back from 20 0 points 0 points 0 points  Months in reverse 0 points 0 points 0  points  Repeat phrase 0 points 2 points 0 points  Total Score 0 points 2 points 0 points    Immunizations Immunization History  Administered Date(s) Administered   Fluad Quad(high Dose 65+) 01/25/2021   Fluad Trivalent(High Dose 65+) 02/04/2023   INFLUENZA, HIGH DOSE SEASONAL PF 03/09/2022   Influenza-Unspecified 12/27/2019   Janssen (J&J) SARS-COV-2 Vaccination 08/13/2019, 03/14/2020   PNEUMOCOCCAL CONJUGATE-20 06/06/2021   Pfizer Covid-19 Vaccine Bivalent Booster 57yrs & up 01/23/2021, 03/09/2022   Pneumococcal Polysaccharide-23 02/28/2019   Td 12/30/2017   Tdap 04/14/2006   Zoster Recombinant(Shingrix) 06/06/2021, 08/02/2021    Screening Tests Health Maintenance  Topic Date Due   Influenza Vaccine  11/13/2023   Medicare Annual Wellness (AWV)  12/09/2023   COVID-19 Vaccine (8 - 2024-25 season) 12/17/2024 (Originally 12/14/2023)   HEMOGLOBIN A1C  03/25/2024   Diabetic kidney evaluation - eGFR measurement  06/02/2024   OPHTHALMOLOGY EXAM  08/13/2024   FOOT EXAM  09/22/2024   Diabetic kidney evaluation - Urine ACR  09/23/2024   MAMMOGRAM  03/25/2025   DTaP/Tdap/Td (3 - Td or Tdap) 12/31/2027   Colonoscopy  10/01/2028   Pneumococcal Vaccine: 50+ Years  Completed   DEXA SCAN  Completed   Hepatitis C Screening  Completed  Zoster Vaccines- Shingrix  Completed   HPV VACCINES  Aged Out   Meningococcal B Vaccine  Aged Out    Health Maintenance  Health Maintenance Due  Topic Date Due   Influenza Vaccine  11/13/2023   Medicare Annual Wellness (AWV)  12/09/2023   Health Maintenance Items Addressed: Undecided about further COVID vaccination, will get flu vaccine at pharmacy.  Mammogram ordered.  Additional Screening:  Vision Screening: Recommended annual ophthalmology exams for early detection of glaucoma and other disorders of the eye. Would you like a referral to an eye doctor? No    Dental Screening: Recommended annual dental exams for proper oral hygiene  Community  Resource Referral / Chronic Care Management: CRR required this visit?  No   CCM required this visit?  No   Plan:    I have personally reviewed and noted the following in the patient's chart:   Medical and social history Use of alcohol, tobacco or illicit drugs  Current medications and supplements including opioid prescriptions. Patient is not currently taking opioid prescriptions. Functional ability and status Nutritional status Physical activity Advanced directives List of other physicians Hospitalizations, surgeries, and ER visits in previous 12 months Vitals Screenings to include cognitive, depression, and falls Referrals and appointments  In addition, I have reviewed and discussed with patient certain preventive protocols, quality metrics, and best practice recommendations. A written personalized care plan for preventive services as well as general preventive health recommendations were provided to patient.   Lolita Libra, CMA   12/18/2023   After Visit Summary: (MyChart) Due to this being a telephonic visit, the after visit summary with patients personalized plan was offered to patient via MyChart   Notes: Nothing significant to report at this time.

## 2023-12-18 NOTE — Addendum Note (Signed)
 Addended by: MAREE HUM A on: 12/18/2023 11:42 AM   Modules accepted: Orders

## 2023-12-18 NOTE — Patient Instructions (Signed)
 Ms. Bothun , Thank you for taking time out of your busy schedule to complete your Annual Wellness Visit with me. I enjoyed our conversation and look forward to speaking with you again next year. I, as well as your care team,  appreciate your ongoing commitment to your health goals. Please review the following plan we discussed and let me know if I can assist you in the future. Your Game plan/ To Do List    Referrals: If you haven't heard from the office you've been referred to, please reach out to them at the phone provided.  Mammogram [employer] [place]):  [phone]  Follow up Visits: Next Medicare AWV with our clinical staff: 12/23/23 11am, telephone.    Next Office Visit with your provider: 03/24/24 1pm, Dr Watt.  Clinician Recommendations:  Aim for 30 minutes of exercise or brisk walking, 6-8 glasses of water , and 5 servings of fruits and vegetables each day.   You will need to get the following vaccines at your local pharmacy:  COVID (if you change your mind),  Flu.    This is a list of the screening recommended for you and due dates:  Health Maintenance  Topic Date Due   Flu Shot  11/13/2023   Medicare Annual Wellness Visit  12/09/2023   COVID-19 Vaccine (8 - 2024-25 season) 12/17/2024*   Hemoglobin A1C  03/25/2024   Yearly kidney function blood test for diabetes  06/02/2024   Eye exam for diabetics  08/13/2024   Complete foot exam   09/22/2024   Yearly kidney health urinalysis for diabetes  09/23/2024   Mammogram  03/25/2025   DTaP/Tdap/Td vaccine (3 - Td or Tdap) 12/31/2027   Colon Cancer Screening  10/01/2028   Pneumococcal Vaccine for age over 28  Completed   DEXA scan (bone density measurement)  Completed   Hepatitis C Screening  Completed   Zoster (Shingles) Vaccine  Completed   HPV Vaccine  Aged Out   Meningitis B Vaccine  Aged Out  *Topic was postponed. The date shown is not the original due date.   **Please bring a copy in with you at your visit in  December.** Advanced directives: (Copy Requested) Please bring a copy of your health care power of attorney and living will to the office to be added to your chart at your convenience. You can mail to [facility] [address] [place], [place] [address] or email to [email or web address] Advance Care Planning is important because it:  [x]  Makes sure you receive the medical care that is consistent with your values, goals, and preferences  [x]  It provides guidance to your family and loved ones and reduces their decisional burden about whether or not they are making the right decisions based on your wishes.  Follow the link provided in your after visit summary or read over the paperwork we have mailed to you to help you started getting your Advance Directives in place. If you need assistance in completing these, please reach out to us  so that we can help you!  See attachments for Preventive Care and Fall Prevention Tips.

## 2024-02-06 ENCOUNTER — Other Ambulatory Visit: Payer: Self-pay | Admitting: Family Medicine

## 2024-02-06 DIAGNOSIS — I252 Old myocardial infarction: Secondary | ICD-10-CM

## 2024-03-22 NOTE — Progress Notes (Unsigned)
 [facility] [address] [place], [place] [address] [phone]  Date:  03/24/2024   Name:  Tiffany Powers   DOB:  12-08-1955   MRN:  [id number]  PCP:  Watt Harlene BROCKS, MD    Chief Complaint: No chief complaint on file.   History of Present Illness:  [name] is a 68 y.o. 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  She is able to walk but not speak.Pt lives with her 2 sisters-both her sister who is chronically ill and her other healthy sister.   She had surgery in February- s/p Robotic assisted lysis of adhesions, supracervical hysterectomy, bilateral salpingo-oophorectomy, sacrocolpopexy Lucita Lite Y), anterior repair, perineorrhaphy, cystoscopy on 06/08/23   Plavix  Zetia  Hydrochlorothiazide  Crestor  Telmisartan  Estrogen cream Xanax   Flushot Eye exam  Lab Results  Component Value Date   HGBA1C 6.6 (H) 09/24/2023       Patient Active Problem List   Diagnosis Date Noted   Diabetes mellitus without complication (HCC)    High cholesterol    Coronary artery disease cardiac catheterization 2017 showing 99% stenosis of small intermediate branch, up to 30% proximal LAD 07/01/2021   Carpal tunnel syndrome of right wrist 10/19/2019   Controlled type 2 diabetes mellitus without complication, without long-term current use of insulin (HCC) 12/31/2017   Pure hypercholesterolemia 02/19/2017   Hyperlipidemia 07/30/2016   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   NSTEMI (non-ST elevated myocardial infarction) (HCC) 12/07/2015   Hypertension 2001   MI (myocardial infarction) (HCC) 2001    Past Medical History:  Diagnosis Date   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   Arthritis    Carpal tunnel  syndrome of right wrist 10/19/2019   Controlled type 2 diabetes mellitus without complication, without long-term current use of insulin (HCC) 12/31/2017   Coronary artery disease cardiac catheterization 2017 showing 99% stenosis of small intermediate branch, up to 30% proximal LAD 07/01/2021   Diabetes mellitus without complication (HCC)    High cholesterol    Hyperlipidemia 07/30/2016   Hypertension    MI (myocardial infarction) (HCC) 2001   NSTEMI (non-ST elevated myocardial infarction) (HCC) 12/07/2015   Pure hypercholesterolemia 02/19/2017    Past Surgical History:  Procedure Laterality Date   CARDIAC CATHETERIZATION  2001; 2002; 12/06/2105   CARDIAC CATHETERIZATION N/A 12/07/2015   Procedure: Left Heart Cath and Coronary Angiography;  Surgeon: Gordy Bergamo, MD;  Location[facility] INVASIVE CV LAB;  Service: Cardiovascular;  Laterality: N/A;   COLONOSCOPY  2023   CYSTOSCOPY N/A 06/08/2023   Procedure: CYSTOSCOPY;  Surgeon: Marilynne Rosaline SAILOR, MD;  Location: [facility] OR;  Service: Gynecology;  Laterality: N/A;   RECTOCELE REPAIR N/A 06/08/2023   Procedure: ANTERIOR REPAIR  WITH PERINEORRHAPHY;  Surgeon: Marilynne Rosaline SAILOR, MD;  Location: [facility] OR;  Service: Gynecology;  Laterality: N/A;   ROBOTIC ASSISTED LAPAROSCOPIC LYSIS OF ADHESION  06/08/2023   Procedure: XI ROBOTIC ASSISTED LAPAROSCOPIC LYSIS OF ADHESION;  Surgeon: Marilynne Rosaline SAILOR, MD;  Location: [facility] OR;  Service: Gynecology;;   TUBAL LIGATION  1980s   XI ROBOTIC ASSISTED TOTAL HYSTERECTOMY WITH SACROCOLPOPEXY N/A 06/08/2023   Procedure: XI ROBOTIC ASSISTED SUPRACERVICAL HYSTERECTOMY WITH BILATERAL SALPINGECTOMY, OOPHORECTOMY AND  SACROCOLPOPEXY;  Surgeon: Marilynne Rosaline SAILOR, MD;  Location:  [facility] OR;  Service: Gynecology;  Laterality: N/A;  Total time requested is 3.5hrs; assist requested [facility]    Social History   Tobacco Use   Smoking status: Never    Passive exposure: Never   Smokeless tobacco: Never  Vaping Use   Vaping status:  Never Used  Substance Use Topics   Alcohol use: Not Currently    Alcohol/week: 1.0 standard drink of alcohol    Types: 1 Glasses of wine per week    Comment: No drinks since 2024   Drug use: No    Family History  Problem Relation Age of Onset   CAD Father 51       MI   Dementia Sister    CAD Brother 104       MI   CAD Maternal Grandmother 24       CABG    No Known Allergies  Medication list has been reviewed and updated.  Current Outpatient Medications on File Prior to Visit  Medication Sig Dispense Refill   acetaminophen  (TYLENOL ) 500 MG tablet Take 1 tablet (500 mg total) by mouth every 6 (six) hours as needed (pain). 30 tablet 0   ALPRAZolam  (XANAX ) 0.25 MG tablet Take 1 tablet (0.25 mg total) by mouth 2 (two) times daily as needed for anxiety. 30 tablet 0   Ascorbic Acid (VITAMIN C PO) Take 1 tablet by mouth daily.     clopidogrel  (PLAVIX ) 75 MG tablet TAKE 1 TABLET BY MOUTH EVERY DAY 90 tablet 3   Cyanocobalamin  (VITAMIN B-12 PO) Take 1 tablet by mouth daily.     estradiol  (ESTRACE ) 0.1 MG/GM vaginal cream Place 0.5g twice a week at night 30 g 11   ezetimibe  (ZETIA ) 10 MG tablet TAKE 1 TABLET BY MOUTH EVERY DAY 90 tablet 3   hydrochlorothiazide  (HYDRODIURIL ) 25 MG tablet TAKE 1 TABLET (25 MG TOTAL) BY MOUTH DAILY. 90 tablet 3   ibuprofen  (ADVIL ) 600 MG tablet Take 1 tablet (600 mg total) by mouth every 6 (six) hours as needed. 30 tablet 0   nitroGLYCERIN  (NITROSTAT ) 0.4 MG SL tablet Place 1 tablet (0.4 mg total) under the tongue every 5 (five) minutes as needed for chest pain. 25 tablet 1   rosuvastatin  (CRESTOR ) 40 MG tablet Take 1 tablet (40 mg total) by mouth daily. 90 tablet 3   telmisartan  (MICARDIS ) 40 MG tablet TAKE 1 TABLET (40 MG TOTAL) BY MOUTH DAILY. NEED APPOINTMENT WITH PCP 90 tablet 0   VITAMIN D  PO Take 1 capsule by mouth daily.     No current facility-administered medications on file prior to visit.    Review of Systems:  As per HPI- otherwise  negative.   Physical Examination: There were no vitals filed for this visit. There were no vitals filed for this visit. There is no height or weight on file to calculate BMI. Ideal Body Weight:    GEN: no acute distress. HEENT: Atraumatic, Normocephalic.  Ears and Nose: No external deformity. CV: RRR, No M/G/R. No JVD. No thrill. No extra heart sounds. PULM: CTA B, no wheezes, crackles, rhonchi. No retractions. No resp. distress. No accessory muscle use. ABD: S, NT, ND, +BS. No rebound. No HSM. EXTR: No c/c/e PSYCH: Normally interactive. Conversant.    Assessment and Plan: No diagnosis found.  Assessment & Plan   Signed Harlene Schroeder, MD

## 2024-03-24 ENCOUNTER — Ambulatory Visit: Admitting: Family Medicine

## 2024-03-24 ENCOUNTER — Encounter: Payer: Self-pay | Admitting: Family Medicine

## 2024-03-24 VITALS — BP 132/80 | HR 66 | Temp 98.1°F | Resp 16 | Ht 60.0 in | Wt 149.2 lb

## 2024-03-24 DIAGNOSIS — E119 Type 2 diabetes mellitus without complications: Secondary | ICD-10-CM

## 2024-03-24 DIAGNOSIS — I10 Essential (primary) hypertension: Secondary | ICD-10-CM

## 2024-03-24 DIAGNOSIS — Z23 Encounter for immunization: Secondary | ICD-10-CM

## 2024-03-24 DIAGNOSIS — F4323 Adjustment disorder with mixed anxiety and depressed mood: Secondary | ICD-10-CM

## 2024-03-24 DIAGNOSIS — I252 Old myocardial infarction: Secondary | ICD-10-CM

## 2024-03-24 LAB — CBC
HCT: 38 % (ref 36.0–46.0)
Hemoglobin: 12.8 g/dL (ref 12.0–15.0)
MCHC: 33.8 g/dL (ref 30.0–36.0)
MCV: 87.5 fl (ref 78.0–100.0)
Platelets: 246 K/uL (ref 150.0–400.0)
RBC: 4.35 Mil/uL (ref 3.87–5.11)
RDW: 13.7 % (ref 11.5–15.5)
WBC: 6.3 K/uL (ref 4.0–10.5)

## 2024-03-24 LAB — COMPREHENSIVE METABOLIC PANEL WITH GFR
ALT: 26 U/L (ref 0–35)
AST: 24 U/L (ref 0–37)
Albumin: 4.5 g/dL (ref 3.5–5.2)
Alkaline Phosphatase: 70 U/L (ref 39–117)
BUN: 15 mg/dL (ref 6–23)
CO2: 31 meq/L (ref 19–32)
Calcium: 9.7 mg/dL (ref 8.4–10.5)
Chloride: 102 meq/L (ref 96–112)
Creatinine, Ser: 0.77 mg/dL (ref 0.40–1.20)
GFR: 78.5 mL/min (ref >60.00)
Glucose, Bld: 98 mg/dL (ref 70–99)
Potassium: 4 meq/L (ref 3.5–5.1)
Sodium: 139 meq/L (ref 135–145)
Total Bilirubin: 0.5 mg/dL (ref 0.2–1.2)
Total Protein: 6.9 g/dL (ref 6.0–8.3)

## 2024-03-24 LAB — HEMOGLOBIN A1C: Hgb A1c MFr Bld: 6.6 % — ABNORMAL HIGH (ref 4.6–6.5)

## 2024-03-24 MED ORDER — TELMISARTAN 40 MG PO TABS: 40.0000 mg | ORAL_TABLET | Freq: Every day | ORAL | 0 refills | Status: AC

## 2024-03-24 MED ORDER — ALPRAZOLAM 0.25 MG PO TABS: 0.2500 mg | ORAL_TABLET | Freq: Two times a day (BID) | ORAL | 0 refills | Status: AC | PRN

## 2024-03-24 NOTE — Patient Instructions (Addendum)
 Good to see you today- I will be in touch with your labs Please do call your cardiologist and set up a recheck visit at your convenience

## 2024-03-28 ENCOUNTER — Inpatient Hospital Stay (HOSPITAL_BASED_OUTPATIENT_CLINIC_OR_DEPARTMENT_OTHER)
Admission: RE | Admit: 2024-03-28 | Discharge: 2024-03-28 | Disposition: A | Source: Ambulatory Visit | Attending: Family Medicine | Admitting: Family Medicine

## 2024-03-28 ENCOUNTER — Encounter (HOSPITAL_BASED_OUTPATIENT_CLINIC_OR_DEPARTMENT_OTHER): Payer: Self-pay

## 2024-03-28 DIAGNOSIS — Z1231 Encounter for screening mammogram for malignant neoplasm of breast: Secondary | ICD-10-CM | POA: Diagnosis present

## 2024-04-12 DIAGNOSIS — M1990 Unspecified osteoarthritis, unspecified site: Secondary | ICD-10-CM | POA: Insufficient documentation

## 2024-04-13 ENCOUNTER — Encounter: Payer: Self-pay | Admitting: Cardiology

## 2024-04-13 ENCOUNTER — Ambulatory Visit: Admitting: Cardiology

## 2024-04-13 VITALS — BP 144/70 | HR 64 | Ht 60.0 in | Wt 146.8 lb

## 2024-04-13 DIAGNOSIS — E782 Mixed hyperlipidemia: Secondary | ICD-10-CM | POA: Diagnosis not present

## 2024-04-13 DIAGNOSIS — I10 Essential (primary) hypertension: Secondary | ICD-10-CM | POA: Diagnosis not present

## 2024-04-13 DIAGNOSIS — I2510 Atherosclerotic heart disease of native coronary artery without angina pectoris: Secondary | ICD-10-CM

## 2024-04-13 DIAGNOSIS — E119 Type 2 diabetes mellitus without complications: Secondary | ICD-10-CM | POA: Diagnosis not present

## 2024-04-13 NOTE — Patient Instructions (Signed)

## 2024-04-13 NOTE — Progress Notes (Signed)
 " Cardiology Office Note:    Date:  04/13/2024   ID:  Tiffany Powers, DOB [birth year], MRN [id number]  PCP:  Watt Harlene BROCKS, MD  Cardiologist:  Lamar Fitch, MD    Referring MD: Watt Harlene BROCKS, MD   No chief complaint on file. Doing well  History of Present Illness:    Tiffany Powers is a 68 y.o. female past medical history significant for coronary artery disease in 2017 she had small myocardial infarction, cardiac catheterization done showed 99% stenosis of very small intermediate branch as well as 30% of proximal LAD no intervention has been needed additional problem include essential hypertension, dyslipidemia, diabetes.  She comes today to my office for follow-up overall she is doing great.  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  This is only 1 episode  Past Medical History:  Diagnosis Date   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   Arthritis    Carpal tunnel syndrome of right wrist 10/19/2019   Cataract 06/2008   Controlled type 2 diabetes mellitus without complication, without long-term current use of insulin (HCC) 12/31/2017   Coronary artery disease cardiac catheterization 2017 showing 99% stenosis of small intermediate branch, up to 30% proximal LAD 07/01/2021   Diabetes mellitus without complication (HCC)    High cholesterol    Hyperlipidemia 07/30/2016   Hypertension    MI (myocardial infarction) (HCC) 2001   NSTEMI (non-ST elevated myocardial infarction) (HCC) 12/07/2015   Pure hypercholesterolemia 02/19/2017    Past Surgical History:  Procedure Laterality Date   ABDOMINAL HYSTERECTOMY  06/08/2023   CARDIAC CATHETERIZATION  2001; 2002; 12/06/2105    CARDIAC CATHETERIZATION N/A 12/07/2015   Procedure: Left Heart Cath and Coronary Angiography;  Surgeon: Gordy Bergamo, MD;  Location: [facility] INVASIVE CV LAB;  Service: Cardiovascular;  Laterality: N/A;   COLONOSCOPY  2023   CYSTOSCOPY N/A 06/08/2023   Procedure: CYSTOSCOPY;  Surgeon: Marilynne Rosaline SAILOR, MD;  Location: [facility] OR;  Service: Gynecology;  Laterality: N/A;   RECTOCELE REPAIR N/A 06/08/2023   Procedure: ANTERIOR REPAIR  WITH PERINEORRHAPHY;  Surgeon: Marilynne Rosaline SAILOR, MD;  Location: [facility] OR;  Service: Gynecology;  Laterality: N/A;   ROBOTIC ASSISTED LAPAROSCOPIC LYSIS OF ADHESION  06/08/2023   Procedure: XI ROBOTIC ASSISTED LAPAROSCOPIC LYSIS OF ADHESION;  Surgeon: Marilynne Rosaline SAILOR, MD;  Location: [facility] OR;  Service: Gynecology;;   TUBAL LIGATION  1980s   XI ROBOTIC ASSISTED TOTAL HYSTERECTOMY WITH SACROCOLPOPEXY N/A 06/08/2023   Procedure: XI ROBOTIC ASSISTED SUPRACERVICAL HYSTERECTOMY WITH BILATERAL SALPINGECTOMY, OOPHORECTOMY AND  SACROCOLPOPEXY;  Surgeon: Marilynne Rosaline SAILOR, MD;  Location: [facility] OR;  Service: Gynecology;  Laterality: N/A;  Total time requested is 3.5hrs; assist requested [facility]    Current Medications: Active Medications[1]   Allergies:   Patient has no known allergies.   Social History   Socioeconomic History   Marital status: Divorced    Spouse name: Not on file   Number of children: Not on file   Years of education: Not on file   Highest education level: Associate degree: occupational, scientist, product/process development, or vocational program  Occupational History   Not on file  Tobacco Use   Smoking status: Never    Passive exposure: Never  Smokeless tobacco: Never  Vaping Use   Vaping status: Never Used  Substance and Sexual Activity   Alcohol use: Not Currently    Alcohol/week: 1.0 standard drink of alcohol    Types: 1 Glasses of wine per week    Comment: No drinks since 2024   Drug use: No   Sexual activity: Not Currently    Birth control/protection: Post-menopausal   Other Topics Concern   Not on file  Social History Narrative   Not on file   Social Drivers of Health   Tobacco Use: Low Risk (04/13/2024)   Patient History    Smoking Tobacco Use: Never    Smokeless Tobacco Use: Never    Passive Exposure: Never  Financial Resource Strain: Low Risk (03/23/2024)   Overall Financial Resource Strain (CARDIA)    Difficulty of Paying Living Expenses: Not very hard  Food Insecurity: No Food Insecurity (03/23/2024)   Epic    Worried About Programme Researcher, Broadcasting/film/video in the Last Year: Never true    Ran Out of Food in the Last Year: Never true  Transportation Needs: No Transportation Needs (03/23/2024)   Epic    Lack of Transportation (Medical): No    Lack of Transportation (Non-Medical): No  Physical Activity: Sufficiently Active (09/17/2023)   Exercise Vital Sign    Days of Exercise per Week: 6 days    Minutes of Exercise per Session: 50 min  Stress: Stress Concern Present (12/18/2023)   [employer] of Occupational Health - Occupational Stress Questionnaire    Feeling of Stress: Rather much  Social Connections: Moderately Integrated (03/23/2024)   Social Connection and Isolation Panel    Frequency of Communication with Friends and Family: More than three times a week    Frequency of Social Gatherings with Friends and Family: Once a week    Attends Religious Services: More than 4 times per year    Active Member of Clubs or Organizations: Yes    Attends Banker Meetings: More than 4 times per year    Marital Status: Divorced  Depression (PHQ2-9): Low Risk (03/24/2024)   Depression (PHQ2-9)    PHQ-2 Score: 4  Alcohol Screen: Low Risk (03/23/2024)   Alcohol Screen    Last Alcohol Screening Score (AUDIT): 1  Housing: Low Risk (03/23/2024)   Epic    Unable to Pay for Housing in the Last Year: No    Number of Times Moved in the Last Year: 0    Homeless in the Last Year: No  Utilities: Not At Risk (12/18/2023)   Epic    Threatened with loss  of utilities: No  Health Literacy: Adequate Health Literacy (12/18/2023)   B1300 Health Literacy    Frequency of need for help with medical instructions: Never     Family History: The patient's family history includes Alcohol abuse in her father; CAD (age of onset: 7) in her brother; CAD (age of onset: 63) in her father; CAD (age of onset: 81) in her maternal grandmother; Dementia in her sister; Diabetes in her mother. ROS:   Please see the history of present illness.    All 14 point review of systems negative except as described per history of present illness  EKGs/Labs/Other Studies Reviewed:         Recent Labs: 09/24/2023: TSH 2.16 03/24/2024: ALT 26; BUN 15; Creatinine, Ser 0.77; Hemoglobin 12.8; Platelets 246.0; Potassium 4.0; Sodium 139  Recent Lipid Panel    Component Value Date/Time   CHOL 129 09/24/2023 1319  TRIG 62 09/24/2023 1319   HDL 49 (L) 09/24/2023 1319   CHOLHDL 2.6 09/24/2023 1319   VLDL 15.0 06/18/2022 1445   LDLCALC 66 09/24/2023 1319   LDLDIRECT 65 03/19/2022 1132    Physical Exam:    VS:  BP (!) 144/70   Pulse 64   Ht 5' (1.524 m)   Wt 146 lb 12.8 oz (66.6 kg)   SpO2 97%   BMI 28.67 kg/m     Wt Readings from Last 3 Encounters:  04/13/24 146 lb 12.8 oz (66.6 kg)  03/24/24 149 lb 4 oz (67.7 kg)  12/18/23 145 lb (65.8 kg)     GEN:  Well nourished, well developed in no acute distress HEENT: Normal NECK: No JVD; No carotid bruits LYMPHATICS: No lymphadenopathy CARDIAC: RRR, no murmurs, no rubs, no gallops RESPIRATORY:  Clear to auscultation without rales, wheezing or rhonchi  ABDOMEN: Soft, non-tender, non-distended MUSCULOSKELETAL:  No edema; No deformity  SKIN: Warm and dry LOWER EXTREMITIES: no swelling NEUROLOGIC:  Alert and oriented x 3 PSYCHIATRIC:  Normal affect   ASSESSMENT:    1. Coronary artery disease involving native coronary artery of native heart without angina pectoris   2. Primary hypertension   3. Controlled type 2  diabetes mellitus without complication, without long-term current use of insulin (HCC)   4. Mixed hyperlipidemia    PLAN:    In order of problems listed above:  Coronary disease stable on guideline directed medical therapy asymptomatic continue present management. Essential hypertension blood pressure well-controlled. Dyslipidemia I do have fasting lipid profile from summer LDL 66 HDL 49 continue present management. Diabetes mellitus I do have hemoglobin A1c from 03/24/2024 6.6 continue present management   Medication Adjustments/Labs and Tests Ordered: Current medicines are reviewed at length with the patient today.  Concerns regarding medicines are outlined above.  No orders of the defined types were placed in this encounter.  Medication changes: No orders of the defined types were placed in this encounter.   Signed, Lamar DOROTHA Fitch, MD, [facility] 04/13/2024 11:45 AM    [facility]    [1]  Current Meds  Medication Sig   acetaminophen  (TYLENOL ) 500 MG tablet Take 1 tablet (500 mg total) by mouth every 6 (six) hours as needed (pain).   ALPRAZolam  (XANAX ) 0.25 MG tablet Take 1 tablet (0.25 mg total) by mouth 2 (two) times daily as needed for anxiety.   Ascorbic Acid (VITAMIN C PO) Take 1 tablet by mouth daily.   clopidogrel  (PLAVIX ) 75 MG tablet TAKE 1 TABLET BY MOUTH EVERY DAY   Cyanocobalamin  (VITAMIN B-12 PO) Take 1 tablet by mouth daily.   estradiol  (ESTRACE ) 0.1 MG/GM vaginal cream Place 0.5g twice a week at night   ezetimibe  (ZETIA ) 10 MG tablet TAKE 1 TABLET BY MOUTH EVERY DAY   hydrochlorothiazide  (HYDRODIURIL ) 25 MG tablet TAKE 1 TABLET (25 MG TOTAL) BY MOUTH DAILY.   ibuprofen  (ADVIL ) 600 MG tablet Take 1 tablet (600 mg total) by mouth every 6 (six) hours as needed.   nitroGLYCERIN  (NITROSTAT ) 0.4 MG SL tablet Place 1 tablet (0.4 mg total) under the tongue every 5 (five) minutes as needed for chest pain.   rosuvastatin  (CRESTOR ) 40 MG tablet Take  1 tablet (40 mg total) by mouth daily.   telmisartan  (MICARDIS ) 40 MG tablet Take 1 tablet (40 mg total) by mouth daily. Need appointment with PCP   VITAMIN D  PO Take 1 capsule by mouth daily.   "

## 2024-04-25 ENCOUNTER — Encounter: Payer: Self-pay | Admitting: *Deleted

## 2024-12-22 ENCOUNTER — Ambulatory Visit
